# Patient Record
Sex: Male | Born: 1975 | Race: Black or African American | Hispanic: No | Marital: Married | Smoking: Never smoker
Health system: Southern US, Community
[De-identification: ages and names within clinical notes are randomized; demographics above are authoritative.]

## PROBLEM LIST (undated history)

## (undated) DIAGNOSIS — K648 Other hemorrhoids: Secondary | ICD-10-CM

## (undated) DIAGNOSIS — R300 Dysuria: Secondary | ICD-10-CM

## (undated) DIAGNOSIS — C61 Malignant neoplasm of prostate: Secondary | ICD-10-CM

## (undated) DIAGNOSIS — N486 Induration penis plastica: Secondary | ICD-10-CM

## (undated) DIAGNOSIS — N35919 Unspecified urethral stricture, male, unspecified site: Secondary | ICD-10-CM

## (undated) HISTORY — PX: PROSTATE BIOPSY: SHX241

## (undated) HISTORY — PX: COLONOSCOPY WITH ESOPHAGOGASTRODUODENOSCOPY (EGD): SHX5779

---

## 2005-07-11 ENCOUNTER — Ambulatory Visit: Payer: Self-pay | Admitting: Sports Medicine

## 2007-02-26 ENCOUNTER — Emergency Department (HOSPITAL_COMMUNITY): Admission: EM | Admit: 2007-02-26 | Discharge: 2007-02-26 | Payer: Self-pay | Admitting: Family Medicine

## 2013-01-08 ENCOUNTER — Ambulatory Visit: Payer: Self-pay | Admitting: Physician Assistant

## 2013-01-08 VITALS — BP 124/78 | HR 64 | Temp 98.2°F | Resp 18 | Ht 72.0 in | Wt 237.0 lb

## 2013-01-08 DIAGNOSIS — Z0289 Encounter for other administrative examinations: Secondary | ICD-10-CM

## 2013-01-08 NOTE — Progress Notes (Signed)
Patient ID: Dennis Newton MRN: [id number], DOB: 1975-11-03 37 y.o. Date of Encounter: 01/08/2013, 2:30 PM  Primary Physician: No primary provider on file.  Chief Complaint: DOT Physical   HPI: 37 y.o. y/o male with history noted below here for DOT physical. Doing well. No issues/complaints. Recertification. Generally healthy. No significant PMH, surgical history, or family history. Father passed form a fall in [place].   SP. GR. 1.005 Protein trace Blood neg Sugar neg  Review of Systems: Consitutional: No fever, chills, fatigue, night sweats, lymphadenopathy, or weight changes. Eyes: No visual changes, eye redness, or discharge. ENT/Mouth: Ears: No otalgia, tinnitus, hearing loss, discharge. Nose: No congestion, rhinorrhea, sinus pain, or epistaxis. Throat: No sore throat, post nasal drip, or teeth pain. Cardiovascular: No CP, palpitations, diaphoresis, DOE, edema, orthopnea, PND. Respiratory: No cough, hemoptysis, SOB, or wheezing. Gastrointestinal: No anorexia, dysphagia, reflux, pain, nausea, vomiting, hematemesis, diarrhea, constipation, BRBPR, or melena. Genitourinary: No dysuria, frequency, urgency, hematuria, incontinence, nocturia, decreased urinary stream, discharge, impotence, or testicular pain/masses. Musculoskeletal: No decreased ROM, myalgias, stiffness, joint swelling, or weakness. Skin: No rash, erythema, lesion changes, pain, warmth, jaundice, or pruritis. Neurological: No headache, dizziness, syncope, seizures, tremors, memory loss, coordination problems, or paresthesias. Psychological: No anxiety, depression, hallucinations, SI/HI. Endocrine: No fatigue, polydipsia, polyphagia, polyuria, or known diabetes. All other systems were reviewed and are otherwise negative.  History reviewed. No pertinent past medical history.   History reviewed. No pertinent past surgical history.  Home Meds:  Prior to Admission medications   Not on File    Allergies: No Known  Allergies  History   Social History  . Marital Status: Married    Spouse Name: N/A    Number of Children: N/A  . Years of Education: N/A   Occupational History  . Not on file.   Social History Main Topics  . Smoking status: Never Smoker   . Smokeless tobacco: Not on file  . Alcohol Use: No  . Drug Use: No  . Sexually Active: Not on file   Other Topics Concern  . Not on file   Social History Narrative  . No narrative on file    History reviewed. No pertinent family history.  Physical Exam: Blood pressure 124/78, pulse 64, temperature 98.2 F (36.8 C), temperature source Oral, resp. rate 18, height 6' (1.829 m), weight 237 lb (107.502 kg), SpO2 100.00%.  General: Well developed, well nourished, in no acute distress. HEENT: Normocephalic, atraumatic. Conjunctiva pink, sclera non-icteric. Pupils 2 mm constricting to 1 mm, round, regular, and equally reactive to light and accomodation. EOMI. Internal auditory canal clear. TMs with good cone of light and without pathology. Nasal mucosa pink. Nares are without discharge. No sinus tenderness. Oral mucosa pink. Dentition normal. Pharynx without exudate.   Neck: Supple. Trachea midline. No thyromegaly. Full ROM. No lymphadenopathy. Lungs: Clear to auscultation bilaterally without wheezes, rales, or rhonchi. Breathing is of normal effort and unlabored. Cardiovascular: RRR with S1 S2. No murmurs, rubs, or gallops appreciated. Distal pulses 2+ symmetrically. No carotid or abdominal bruits. Abdomen: Soft, non-tender, non-distended with normoactive bowel sounds. No hepatosplenomegaly or masses. No rebound/guarding. No CVA tenderness. Without hernias.  Genitourinary: Circumcised male. No penile lesions. Testes descended bilaterally, and smooth without tenderness or masses.  Musculoskeletal: Full range of motion and 5/5 strength throughout. Without swelling, atrophy, tenderness, crepitus, or warmth. Extremities without clubbing, cyanosis, or  edema. Calves supple. Skin: Warm and moist without erythema, ecchymosis, wounds, or rash. Neuro: A+Ox3. CN II-XII grossly intact. Moves all extremities spontaneously.  Full sensation throughout. Normal gait. DTR 2+ throughout upper and lower extremities. Finger to nose intact. Psych:  Responds to questions appropriately with a normal affect.    Assessment/Plan:  37 y.o. y/o male here for DOT physical. -Cleared -Two year card issued -Form completed -RTC prn  Signed, Eula Listen, PA-C 01/08/2013 2:30 PM

## 2013-01-17 ENCOUNTER — Encounter: Payer: Self-pay | Admitting: Physician Assistant

## 2015-01-16 ENCOUNTER — Ambulatory Visit: Payer: 59 | Admitting: Family Medicine

## 2015-01-16 ENCOUNTER — Encounter: Payer: Self-pay | Admitting: Family Medicine

## 2015-01-16 ENCOUNTER — Ambulatory Visit (INDEPENDENT_AMBULATORY_CARE_PROVIDER_SITE_OTHER): Payer: 59 | Admitting: Family Medicine

## 2015-01-16 ENCOUNTER — Ambulatory Visit (HOSPITAL_COMMUNITY)
Admission: RE | Admit: 2015-01-16 | Discharge: 2015-01-16 | Disposition: A | Discharge status: Home / Self Care | Payer: 59 | Source: Ambulatory Visit | Attending: Family Medicine | Admitting: Family Medicine

## 2015-01-16 ENCOUNTER — Other Ambulatory Visit: Payer: Self-pay | Admitting: Family Medicine

## 2015-01-16 VITALS — BP 120/90 | HR 66 | Temp 98.3°F | Resp 16 | Ht 72.75 in | Wt 235.0 lb

## 2015-01-16 DIAGNOSIS — R1013 Epigastric pain: Secondary | ICD-10-CM

## 2015-01-16 DIAGNOSIS — Z0000 Encounter for general adult medical examination without abnormal findings: Secondary | ICD-10-CM

## 2015-01-16 DIAGNOSIS — K921 Melena: Secondary | ICD-10-CM | POA: Diagnosis not present

## 2015-01-16 DIAGNOSIS — K7469 Other cirrhosis of liver: Secondary | ICD-10-CM

## 2015-01-16 LAB — POCT CBC
Granulocyte percent: 43.6 %G (ref 37–80)
HCT, POC: 43.8 % (ref 43.5–53.7)
Hemoglobin: 14.3 g/dL (ref 14.1–18.1)
Lymph, poc: 3 (ref 0.6–3.4)
MCH, POC: 28.7 pg (ref 27–31.2)
MCHC: 32.7 g/dL (ref 31.8–35.4)
MCV: 87.7 fL (ref 80–97)
MID (cbc): 0.5 (ref 0–0.9)
MPV: 8.4 fL (ref 0–99.8)
POC Granulocyte: 2.7 (ref 2–6.9)
POC LYMPH PERCENT: 48.2 %L (ref 10–50)
POC MID %: 8.2 %M (ref 0–12)
Platelet Count, POC: 243 10*3/uL (ref 142–424)
RBC: 4.99 M/uL (ref 4.69–6.13)
RDW, POC: 13.5 %
WBC: 6.3 10*3/uL (ref 4.6–10.2)

## 2015-01-16 MED ORDER — OMEPRAZOLE 20 MG PO CPDR
20.0000 mg | DELAYED_RELEASE_CAPSULE | Freq: Every day | ORAL | Status: DC
Start: 2015-01-16 — End: 2015-07-15

## 2015-01-16 MED ORDER — IOHEXOL 300 MG/ML  SOLN
100.0000 mL | Freq: Once | INTRAMUSCULAR | Status: AC | PRN
  Administered 2015-01-16: 100 mL via INTRAVENOUS

## 2015-01-16 NOTE — Progress Notes (Signed)
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  Patient's nonsmoker and nondrinker. He has no known medical problems today.  Objective: Patient is well-developed, well-nourished adult male in no acute distress. HEENT: Unremarkable with normal fundi, normal TMs, normal oropharynx Neck: Supple no adenopathy Chest: Clear Heart: Regular no murmur Abdomen: Soft but tender in the epigastrium with fullness there. Skin: Normal  Genitalia: Normal Extremities: Normal  Assessment: Negative evaluate the abdominal pain before we can allow per minute

## 2015-01-16 NOTE — Patient Instructions (Signed)
Go to [facility] for CT scan Abdomen and Pelvis with contrast Enter the ER and Register as an [place] [phone]

## 2015-01-16 NOTE — Progress Notes (Unsigned)
CT report shows hepatic cirrhosis.     ICD-9-CM ICD-10-CM   1. Abdominal pain, epigastric 789.06 R10.13 omeprazole (PRILOSEC) 20 MG capsule     Mitochondrial Antibodies  2. Other cirrhosis of liver 571.5 K74.69 ANA     Ceruloplasmin     IBC Panel     Alpha-1-Antitrypsin     Hepatitis C antibody     Hepatitis B surface antibody     Mitochondrial Antibodies     Signed, Robyn Haber, MD

## 2015-01-16 NOTE — Progress Notes (Addendum)
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 He describes pain as mild most of the time but punctuated with sharp stabbing pains that lasts just seconds.  He does not smoke or drink He's never had this before, does not have nausea or vomiting  He has noted some blood in his stool.  Objective: Is a well-developed very athletic appearing adult male in no acute distress HEENT: Unremarkable Neck: Supple no adenopathy Chest: Clear Heart: Regular no murmur Abdomen: Tender epigastrium with fullness, no rebound or guarding. No hernia Skin: No rash Extremities: No edema Results for orders placed or performed in visit on 01/16/15  POCT CBC  Result Value Ref Range   WBC 6.3 4.6 - 10.2 K/uL   Lymph, poc 3.0 0.6 - 3.4   POC LYMPH PERCENT 48.2 10 - 50 %L   MID (cbc) 0.5 0 - 0.9   POC MID % 8.2 0 - 12 %M   POC Granulocyte 2.7 2 - 6.9   Granulocyte percent 43.6 37 - 80 %G   RBC 4.99 4.69 - 6.13 M/uL   Hemoglobin 14.3 14.1 - 18.1 g/dL   HCT, POC 43.8 43.5 - 53.7 %   MCV 87.7 80 - 97 fL   MCH, POC 28.7 27 - 31.2 pg   MCHC 32.7 31.8 - 35.4 g/dL   RDW, POC 13.5 %   Platelet Count, POC 243 142 - 424 K/uL   MPV 8.4 0 - 99.8 fL    Assessment: Unclear what is causing the abdominal pain. It's definitely suggestive of ulcer disease or pancreatitis  Plan: Abdominal CT with and without contrast today. This chart was scribed in my presence and reviewed by me personally.    ICD-9-CM ICD-10-CM   1. Abdominal pain, epigastric 789.06 R10.13 CT Abdomen Pelvis W Contrast     POCT CBC   CT called to report that patient has cirrhosis. I have written for future lab work to be done tomorrow and will call patient tonight to let him know.  Signed, Robyn Haber, MD   Signed, Carola Frost.D.

## 2015-01-17 ENCOUNTER — Other Ambulatory Visit: Payer: Self-pay | Admitting: Family Medicine

## 2015-01-17 ENCOUNTER — Other Ambulatory Visit (INDEPENDENT_AMBULATORY_CARE_PROVIDER_SITE_OTHER): Payer: 59 | Admitting: Emergency Medicine

## 2015-01-17 ENCOUNTER — Ambulatory Visit (INDEPENDENT_AMBULATORY_CARE_PROVIDER_SITE_OTHER): Payer: Self-pay | Admitting: Emergency Medicine

## 2015-01-17 VITALS — BP 126/78 | HR 54 | Temp 98.1°F | Resp 16 | Ht 72.9 in | Wt 236.2 lb

## 2015-01-17 DIAGNOSIS — K7469 Other cirrhosis of liver: Secondary | ICD-10-CM | POA: Diagnosis not present

## 2015-01-17 DIAGNOSIS — R1013 Epigastric pain: Secondary | ICD-10-CM

## 2015-01-17 DIAGNOSIS — Z029 Encounter for administrative examinations, unspecified: Secondary | ICD-10-CM

## 2015-01-17 LAB — HEPATITIS C ANTIBODY: HCV Ab: NEGATIVE

## 2015-01-17 LAB — HEPATITIS B SURFACE ANTIBODY, QUANTITATIVE: Hepatitis B-Post: 0.7 m[IU]/mL

## 2015-01-17 NOTE — Addendum Note (Signed)
Addended by: Robyn Haber on: 01/17/2015 07:56 AM   Modules accepted: Orders

## 2015-01-17 NOTE — Progress Notes (Signed)
[facility] [address], [place] [address] [phone]  Date:  01/17/2015   Name:  Dennis Newton   DOB:  1976-04-09   MRN:  [id number]  PCP:  No PCP Per Patient    Chief Complaint: Follow-up   History of Present Illness:  Dennis Newton is a 39 y.o. very pleasant male patient who presents with the following:  DOT  There are no active problems to display for this patient.   History reviewed. No pertinent past medical history.  History reviewed. No pertinent past surgical history.  History  Substance Use Topics  . Smoking status: Never Smoker   . Smokeless tobacco: Not on file  . Alcohol Use: No    History reviewed. No pertinent family history.  No Known Allergies  Medication list has been reviewed and updated.  Current Outpatient Prescriptions on File Prior to Visit  Medication Sig Dispense Refill  . omeprazole (PRILOSEC) 20 MG capsule Take 1 capsule (20 mg total) by mouth daily. 30 capsule 3   No current facility-administered medications on file prior to visit.    Review of Systems:  As per HPI, otherwise negative.    Physical Examination: Filed Vitals:   01/17/15 0834  BP: 126/78  Pulse: 54  Temp: 98.1 F (36.7 C)  Resp: 16   Filed Vitals:   01/17/15 0834  Height: 6' 0.9" (1.852 m)  Weight: 236 lb 3.2 oz (107.14 kg)   Body mass index is 31.24 kg/(m^2). Ideal Body Weight: Weight in (lb) to have BMI = 25: 188.6  GEN: WDWN, NAD, Non-toxic, A & O x 3 HEENT: Atraumatic, Normocephalic. Neck supple. No masses, No LAD. Ears and Nose: No external deformity. CV: RRR, No M/G/R. No JVD. No thrill. No extra heart sounds. PULM: CTA B, no wheezes, crackles, rhonchi. No retractions. No resp. distress. No accessory muscle use. ABD: S, NT, ND, +BS. No rebound. No HSM. EXTR: No c/c/e NEURO Normal gait.  PSYCH: Normally interactive. Conversant. Not depressed or anxious appearing.  Calm demeanor.    Assessment and  Plan: DOT  Signed,  Ellison Carwin, MD

## 2015-01-18 ENCOUNTER — Telehealth: Payer: Self-pay | Admitting: *Deleted

## 2015-01-18 NOTE — Progress Notes (Signed)
Patient came in on 01/17/15 for bloodwork

## 2015-01-18 NOTE — Telephone Encounter (Signed)
Called pt back and gave results from Dr. Carlean Jews regarding CT results.  Pt was told to return for recheck on Tuesday between 4-8:30pm.  Pt understood

## 2015-01-19 LAB — MITOCHONDRIAL ANTIBODIES: Mitochondrial M2 Ab, IgG: 0.33 (ref <0.91)

## 2015-01-19 LAB — ANA: Anti Nuclear Antibody(ANA): NEGATIVE

## 2015-01-19 LAB — IRON AND TIBC
%SAT: 50 % (ref 20–55)
Iron: 130 ug/dL (ref 42–165)
TIBC: 262 ug/dL (ref 215–435)
UIBC: 132 ug/dL (ref 125–400)

## 2015-01-20 ENCOUNTER — Ambulatory Visit (INDEPENDENT_AMBULATORY_CARE_PROVIDER_SITE_OTHER): Payer: 59 | Admitting: Family Medicine

## 2015-01-20 ENCOUNTER — Encounter: Payer: Self-pay | Admitting: Family Medicine

## 2015-01-20 VITALS — BP 112/78 | HR 54 | Temp 98.5°F | Resp 20 | Ht 72.75 in | Wt 241.0 lb

## 2015-01-20 DIAGNOSIS — K921 Melena: Secondary | ICD-10-CM

## 2015-01-20 DIAGNOSIS — R1013 Epigastric pain: Secondary | ICD-10-CM

## 2015-01-20 DIAGNOSIS — R935 Abnormal findings on diagnostic imaging of other abdominal regions, including retroperitoneum: Secondary | ICD-10-CM | POA: Diagnosis not present

## 2015-01-20 DIAGNOSIS — G8929 Other chronic pain: Secondary | ICD-10-CM

## 2015-01-20 NOTE — Progress Notes (Addendum)
Subjective:  This chart was scribed for Robyn Haber, MD by Molli Posey, Medical scribe. This patient was seen in ROOM 5 and the patient's care was started 7:59 PM.   Patient ID: Dennis Newton, male    DOB: 15-Nov-1975, 39 y.o.   MRN: [id number]   Chief Complaint  Patient presents with   Follow-up    Results for DOT    HPI HPI Comments: Dennis Newton is a 39 y.o. male who presents to [facility] for results for his DOT from last week. Dr. Ouida Sills completed his DOT noting patient's abdominal exam is normal.   Pt complains of abdominal pain and states that he has noticed blood in his stool recently. Pt states he has been taking omeprazole regularly since his visit here several days ago. He states he does not take large amounts of ibuprofen and says he takes no other medications.   He reports no alleviating or modifying factors at this time. Pt reports no pertinent past medical history.   History reviewed. No pertinent past medical history. There are no active problems to display for this patient.  No Known Allergies Current Outpatient Prescriptions on File Prior to Visit  Medication Sig Dispense Refill   omeprazole (PRILOSEC) 20 MG capsule Take 1 capsule (20 mg total) by mouth daily. 30 capsule 3   No current facility-administered medications on file prior to visit.   History   Social History   Marital Status: Married    Spouse Name: N/A   Number of Children: N/A   Years of Education: N/A   Occupational History   Not on file.   Social History Main Topics   Smoking status: Never Smoker    Smokeless tobacco: Never Used   Alcohol Use: No   Drug Use: No   Sexual Activity: Not on file   Other Topics Concern   Not on file   Social History Narrative   .  Review of Systems  Gastrointestinal: Positive for abdominal pain and blood in stool.  All other systems reviewed and are negative.      Objective:   Physical Exam  Constitutional: He is oriented  to person, place, and time. He appears well-developed and well-nourished.  HENT:  Head: Normocephalic and atraumatic.  Eyes: Right eye exhibits no discharge. Left eye exhibits no discharge.  Neck: Neck supple. No tracheal deviation present.  Cardiovascular: Normal rate.   Pulmonary/Chest: Effort normal. No respiratory distress.  Abdominal: He exhibits no distension.  Patient has fullness and tenderness in the epigastrium. He has no splenomegaly.  The CT scan does show an irregularity of the liver.  Neurological: He is alert and oriented to person, place, and time.  Skin: Skin is warm and dry.  Psychiatric: He has a normal mood and affect. His behavior is normal.  Nursing note and vitals reviewed.  Results for orders placed or performed in visit on 01/17/15  Hepatitis C antibody  Result Value Ref Range   HCV Ab NEGATIVE NEGATIVE  Hepatitis B surface antibody  Result Value Ref Range   Hepatitis B-Post 0.7 mIU/mL  Mitochondrial Antibodies  Result Value Ref Range   Mitochondrial M2 Ab, IgG 0.33 <0.91  ANA  Result Value Ref Range   Anit Nuclear Antibody(ANA) NEG NEGATIVE  Ceruloplasmin  Result Value Ref Range   Ceruloplasmin    Alpha-1-Antitrypsin  Result Value Ref Range   A-1 Antitrypsin, Ser    Iron and TIBC  Result Value Ref Range   Iron 130 42 - 165 ug/dL  UIBC 132 125 - 400 ug/dL   TIBC 262 215 - 435 ug/dL   %SAT 50 20 - 55 %    Filed Vitals:   01/20/15 1941  BP: 112/78  Pulse: 54  Temp: 98.5 F (36.9 C)  TempSrc: Oral  Resp: 20  Height: 6' 0.75" (1.848 m)  Weight: 241 lb (109.317 kg)  SpO2: 97%      Assessment & Plan:  I'm concerned about the abnormal liver on the CT scan. All the lab looks pretty reassuring but with the recent development of some blood in the stools and persistent epigastric pain (it has improved on the Prilosec), further evaluation is indicated  This chart was scribed in my presence and reviewed by me personally.    ICD-9-CM ICD-10-CM    1. Abdominal pain, chronic, epigastric 789.06 R10.13 Ambulatory referral to Gastroenterology   338.29 G89.29 Comprehensive metabolic panel  2. Blood in stool 578.1 K92.1 Ambulatory referral to Gastroenterology     Comprehensive metabolic panel  3. Abnormal abdominal CT scan 793.6 R93.5 Comprehensive metabolic panel   I have asked the lab to include liver function tests on the blood drawn several days ago.  Signed, Robyn Haber, MD

## 2015-01-20 NOTE — Addendum Note (Signed)
Addended by: Robyn Haber on: 01/20/2015 10:29 PM   Modules accepted: Orders, Level of Service

## 2015-01-21 LAB — CERULOPLASMIN: Ceruloplasmin: 34 mg/dL (ref 18–36)

## 2015-01-21 LAB — ALPHA-1-ANTITRYPSIN: A-1 Antitrypsin, Ser: 124 mg/dL (ref 83–199)

## 2015-01-22 ENCOUNTER — Encounter: Payer: Self-pay | Admitting: *Deleted

## 2015-01-22 ENCOUNTER — Encounter: Payer: Self-pay | Admitting: Nurse Practitioner

## 2015-01-22 LAB — COMPREHENSIVE METABOLIC PANEL
ALT: 19 U/L (ref 0–53)
AST: 16 U/L (ref 0–37)
Albumin: 4.6 g/dL (ref 3.5–5.2)
Alkaline Phosphatase: 80 U/L (ref 39–117)
BUN: 10 mg/dL (ref 6–23)
CO2: 22 mEq/L (ref 19–32)
Calcium: 9.6 mg/dL (ref 8.4–10.5)
Chloride: 104 mEq/L (ref 96–112)
Creat: 0.95 mg/dL (ref 0.50–1.35)
Glucose, Bld: 84 mg/dL (ref 70–99)
Potassium: 4.5 mEq/L (ref 3.5–5.3)
Sodium: 142 mEq/L (ref 135–145)
Total Bilirubin: 0.5 mg/dL (ref 0.2–1.2)
Total Protein: 7.7 g/dL (ref 6.0–8.3)

## 2015-02-09 ENCOUNTER — Encounter: Payer: Self-pay | Admitting: Nurse Practitioner

## 2015-02-09 ENCOUNTER — Encounter: Payer: Self-pay | Admitting: Gastroenterology

## 2015-02-09 ENCOUNTER — Ambulatory Visit (INDEPENDENT_AMBULATORY_CARE_PROVIDER_SITE_OTHER): Payer: 59 | Admitting: Nurse Practitioner

## 2015-02-09 VITALS — BP 108/76 | HR 64 | Ht 73.0 in | Wt 244.0 lb

## 2015-02-09 DIAGNOSIS — R143 Flatulence: Secondary | ICD-10-CM | POA: Diagnosis not present

## 2015-02-09 DIAGNOSIS — K625 Hemorrhage of anus and rectum: Secondary | ICD-10-CM | POA: Insufficient documentation

## 2015-02-09 DIAGNOSIS — N5312 Painful ejaculation: Secondary | ICD-10-CM

## 2015-02-09 DIAGNOSIS — IMO0001 Reserved for inherently not codable concepts without codable children: Secondary | ICD-10-CM

## 2015-02-09 DIAGNOSIS — R1010 Upper abdominal pain, unspecified: Secondary | ICD-10-CM | POA: Diagnosis not present

## 2015-02-09 MED ORDER — NA SULFATE-K SULFATE-MG SULF 17.5-3.13-1.6 GM/177ML PO SOLN: ORAL | Status: DC

## 2015-02-09 NOTE — Patient Instructions (Addendum)
You have been scheduled for an endoscopy and colonoscopy. Please follow the written instructions given to you at your visit today. Please pick up your prep supplies at the pharmacy within the next 1-3 days. If you use inhalers (even only as needed), please bring them with you on the day of your procedure. Your physician has requested that you go to www.startemmi.com and enter the access code given to you at your visit today. This web site gives a general overview about your procedure. However, you should still follow specific instructions given to you by our office regarding your preparation for the procedure.  You may have a light breakfast the morning of prep day (the day before the procedure). You may choose from: eggs, toast, chicken noodle soup, crackers.  You should have your breakfast completed between 8:00 and 9:00 am.  Clear liquids only for the rest of the day on prep day and up until 3 hours before procedure.  Please purchase the following medications over the counter and take as directed: Align for 1 month  Please contact your primary care provider to make a referral to Urology regarding pain during sex

## 2015-02-09 NOTE — Progress Notes (Signed)
HPI :  Patient is a pleasant 39 year old male from [place] referred by PCP for abdominal pain and blood in stool.  Patient describes intermittent, non-radiating epigastric pain present for almost a month now. Pain is unrelated to meals or position. His stomach makes a lot of loud noises and he excessive flatus. There is no associated nausea / vomiting.   Daily PPI isn't helping.  [facility] of liver disease. No history of of significant ETOH use.   Patient recently began having painless rectal bleeding with bowel movements. No associated constipation nor diarrhea.   PSH: none  FMH: No liver disease or colon cancers  History  Substance Use Topics  . Smoking status: Never Smoker   . Smokeless tobacco: Never Used  . Alcohol Use: No   Current Outpatient Prescriptions  Medication Sig Dispense Refill  . omeprazole (PRILOSEC) 20 MG capsule Take 1 capsule (20 mg total) by mouth daily. 30 capsule 3   No current facility-administered medications for this visit.   No Known Allergies   Review of Systems: All systems reviewed and negative except where noted in HPI.    Ct Abdomen Pelvis W Contrast  01/16/2015   CLINICAL DATA:  Epigastric pain, bright red blood in stool.  EXAM: CT ABDOMEN AND PELVIS WITH CONTRAST  TECHNIQUE: Multidetector CT imaging of the abdomen and pelvis was performed using the standard protocol following bolus administration of intravenous contrast.  CONTRAST:  134mL OMNIPAQUE IOHEXOL 300 MG/ML  SOLN  COMPARISON:  None.  FINDINGS: Normal heart size.  Lung bases clear.  Lobular hepatic contour. Sub cm hypodensity right hepatic lobe (series 2, image 23). Spleen measures 11.7 cm cranial caudal. No radiodense gallstones or biliary ductal dilatation. No appreciable abnormality of the pancreas and adrenal glands. Upper pole right renal cysts. Otherwise, symmetric renal enhancement and excretion. No hydroureteronephrosis.  No overt colitis. Normal appendix. Small bowel loops are of  normal course and caliber. No free intraperitoneal air or fluid. No lymphadenopathy.  Normal caliber abdominal aorta and branch vessels. Mild right common iliac atherosclerotic disease.  Decompressed bladder.  Normal size prostate gland.  No acute osseous finding.  IMPRESSION: No acute abdominopelvic process identified by CT.  Liver contour is mildly lobular. Correlate with LFTs and clinical risk factors as of early cirrhosis is not excluded.  Sub cm hypodensity right hepatic lobe is nonspecific. Recommend six-month liver ultrasound follow-up.   Electronically Signed   By: Carlos Levering M.D.   On: 01/16/2015 19:01    Physical Exam: BP 108/76 mmHg  Pulse 64  Ht 6\' 1"  (1.854 m)  Wt 244 lb (110.678 kg)  BMI 32.20 kg/m2 Constitutional: Pleasant,well-developed, black male in no acute distress. HEENT: Normocephalic and atraumatic. Conjunctivae are normal. No scleral icterus. Neck supple.  Cardiovascular: Normal rate, regular rhythm.  Pulmonary/chest: Effort normal and breath sounds normal. No wheezing, rales or rhonchi. Abdominal: Soft, nondistended, nontender. Bowel sounds active throughout. There are no masses palpable. No hepatomegaly. Rectal: no external lesions. No masses felt on DRE. Stool heme negative.  Extremities: no edema Lymphadenopathy: No cervical adenopathy noted. Neurological: Alert and oriented to person place and time. Skin: Skin is warm and dry. No rashes noted. Psychiatric: Normal mood and affect. Behavior is normal.   ASSESSMENT AND PLAN:  51. 39 year old male with epigastric pain of unclear etiology. Patient denied NSAID use to me but after he left the office I saw in PCPs note that patient had been taking large quantities of ibuprofen.  Obviously peptic ulcer disease  needs to be excluded. Of note, CT scan of the abdomen and pelvis negative for acute findings. For further evaluation of epigastric pain patient will be scheduled for upper endoscopy. The benefits, risks, and  potential complications of EGD with possible biopsies  were discussed with the patient and he agrees to proceed.    2. Rectal bleeding. This may be from internal hemorrhoids but need to exclude other etiologies. For further evaluation patient will be scheduled for colonoscopy to be done at time of upper endoscopy. The risks, benefits, and alternatives to colonoscopy with possible biopsy and possible polypectomy were discussed with the patient and he consents to proceed.   3.  Abnormal CT scan - mildly lobular liver.Rule out cirrhosis   Platelets normal. No stigmata of chronic liver disease on exam. Patient is unaware of any chronic liver problems. No family history of liver disease. No significant alcohol use. His LFTs are normal. Autoimmune/genetic markers negative. Hepatitis C antibody negative. Hepatitis B antibody is negative.    Will evaluate for portal HTN at time of EGD.    Further evaluation of possible chronic liver disease once acute issues have resolved. He will need coag studies, HBsurface antigen  4. Penile pain with ejaculation. I asked him to contact PCP regarding this issue. Denies penile discharge. Asked patient to notify PCP about this issue, could have prostatitis.    CC: Robyn Haber, MD

## 2015-02-10 ENCOUNTER — Encounter: Payer: Self-pay | Admitting: Nurse Practitioner

## 2015-02-10 DIAGNOSIS — N5312 Painful ejaculation: Secondary | ICD-10-CM | POA: Insufficient documentation

## 2015-02-11 NOTE — Progress Notes (Signed)
Reviewed and agree with management. Recommend elastography for further w/u of possible cirrhosis Dennis Newton. Deatra Ina, M.D., [facility]

## 2015-02-16 ENCOUNTER — Other Ambulatory Visit: Payer: Self-pay

## 2015-02-16 DIAGNOSIS — R932 Abnormal findings on diagnostic imaging of liver and biliary tract: Secondary | ICD-10-CM

## 2015-02-16 NOTE — Progress Notes (Signed)
Dennis Newton, [place] - 02/09/15 >','<< Less Detail',event)" href="javascript:;">More Detail >>   Willia Craze, NP   Sent: Fri February 13, 2015 11:59 AM    To: Marlon Pel, RN        Message     Barbera Setters, can you schedule patient for ultrasound elastography for further evaluation of lobular liver. Rule out cirrhosis.     Thanks     ----- Message -----     From: Inda Castle, MD     Sent: 02/11/2015  2:57 PM      To: Willia Craze, NP                ----- Message -----     From: Willia Craze, NP     Sent: 02/11/2015 10:30 AM      To: Inda Castle, MD        Patient notified of the appt date and time at [facility] and to arrive at 6:45 for 7:00 on 03/11/15.  Patient needs to be NPO after midnignt

## 2015-03-11 ENCOUNTER — Ambulatory Visit (HOSPITAL_COMMUNITY)
Admission: RE | Admit: 2015-03-11 | Discharge: 2015-03-11 | Disposition: A | Discharge status: Home / Self Care | Payer: 59 | Source: Ambulatory Visit | Attending: Nurse Practitioner | Admitting: Nurse Practitioner

## 2015-03-11 DIAGNOSIS — R932 Abnormal findings on diagnostic imaging of liver and biliary tract: Secondary | ICD-10-CM

## 2015-03-11 DIAGNOSIS — N281 Cyst of kidney, acquired: Secondary | ICD-10-CM | POA: Insufficient documentation

## 2015-03-11 DIAGNOSIS — K7460 Unspecified cirrhosis of liver: Secondary | ICD-10-CM | POA: Diagnosis present

## 2015-04-07 ENCOUNTER — Encounter: Payer: Self-pay | Admitting: Gastroenterology

## 2015-04-07 ENCOUNTER — Ambulatory Visit (AMBULATORY_SURGERY_CENTER): Payer: 59 | Admitting: Gastroenterology

## 2015-04-07 VITALS — BP 119/83 | HR 51 | Temp 97.7°F | Resp 12 | Ht 73.0 in | Wt 244.0 lb

## 2015-04-07 DIAGNOSIS — K625 Hemorrhage of anus and rectum: Secondary | ICD-10-CM

## 2015-04-07 DIAGNOSIS — K648 Other hemorrhoids: Secondary | ICD-10-CM | POA: Diagnosis not present

## 2015-04-07 DIAGNOSIS — R1010 Upper abdominal pain, unspecified: Secondary | ICD-10-CM

## 2015-04-07 MED ORDER — SODIUM CHLORIDE 0.9 % IV SOLN: 500.0000 mL | INTRAVENOUS | Status: DC

## 2015-04-07 NOTE — Progress Notes (Signed)
Report to PACU, RN, vss, BBS= Clear.  

## 2015-04-07 NOTE — Op Note (Signed)
[place]  [address]. [place], [id number]   ENDOSCOPY PROCEDURE REPORT  PATIENT: Dennis, Newton  MR#: [id number] BIRTHDATE: [birth year] , 39  yrs. old GENDER: male ENDOSCOPIST: Inda Castle, MD REFERRED BY: PROCEDURE DATE:  04/07/2015 PROCEDURE:  EGD, diagnostic ASA CLASS:     Class I INDICATIONS:  epigastric pain. MEDICATIONS: Residual sedation present, Propofol 100 mg IV, and Lidocaine 200 mg IV TOPICAL ANESTHETIC:  DESCRIPTION OF PROCEDURE: After the risks benefits and alternatives of the procedure were thoroughly explained, informed consent was obtained.  The LB [id number] endoscope was introduced through the mouth and advanced to the second portion of the duodenum , Without limitations.  The instrument was slowly withdrawn as the mucosa was fully examined.      EXAM: The esophagus and gastroesophageal junction were completely normal in appearance.  The stomach was entered and closely examined.The antrum, angularis, and lesser curvature were well visualized, including a retroflexed view of the cardia and fundus. The stomach wall was normally distensable.  The scope passed easily through the pylorus into the duodenum.  Retroflexed views revealed no abnormalities.     The scope was then withdrawn from the patient and the procedure completed.  COMPLICATIONS: There were no immediate complications.  ENDOSCOPIC IMPRESSION: Normal appearing esophagus and GE junction, the stomach was well visualized and normal in appearance, normal appearing duodenum  RECOMMENDATIONS: Oh further therapy or workup since symptoms are improving  REPEAT EXAM:  eSigned:  Inda Castle, MD 04/07/2015 2:57 PM    CC:

## 2015-04-07 NOTE — Patient Instructions (Addendum)
Purchase from the pharmacy prepation h suppositories for relief from hemorroids. If this does not work make an appointment with dr. Deatra Ina for hemorroidal banding.  Purchase simethicone or mylicon at the pharmacy, take this medication 2-3 times daily for relief of gas. If this does not work call Dr. Kelby Fam office for an office visit.     YOU HAD AN ENDOSCOPIC PROCEDURE TODAY AT [facility]:   Refer to the procedure report that was given to you for any specific questions about what was found during the examination.  If the procedure report does not answer your questions, please call your gastroenterologist to clarify.  If you requested that your care partner not be given the details of your procedure findings, then the procedure report has been included in a sealed envelope for you to review at your convenience later.  YOU SHOULD EXPECT: Some feelings of bloating in the abdomen. Passage of more gas than usual.  Walking can help get rid of the air that was put into your GI tract during the procedure and reduce the bloating. If you had a lower endoscopy (such as a colonoscopy or flexible sigmoidoscopy) you may notice spotting of blood in your stool or on the toilet paper. If you underwent a bowel prep for your procedure, you may not have a normal bowel movement for a few days.  Please Note:  You might notice some irritation and congestion in your nose or some drainage.  This is from the oxygen used during your procedure.  There is no need for concern and it should clear up in a day or so.  SYMPTOMS TO REPORT IMMEDIATELY:   Following lower endoscopy (colonoscopy or flexible sigmoidoscopy):  Excessive amounts of blood in the stool  Significant tenderness or worsening of abdominal pains  Swelling of the abdomen that is new, acute  Fever of 100F or higher   Following upper endoscopy (EGD)  Vomiting of blood or coffee ground material  New chest pain or pain under the shoulder  blades  Painful or persistently difficult swallowing  New shortness of breath  Fever of 100F or higher  Black, tarry-looking stools  For urgent or emergent issues, a gastroenterologist can be reached at any hour by calling [phone].   DIET: Your first meal following the procedure should be a small meal and then it is ok to progress to your normal diet. Heavy or fried foods are harder to digest and may make you feel nauseous or bloated.  Likewise, meals heavy in dairy and vegetables can increase bloating.  Drink plenty of fluids but you should avoid alcoholic beverages for 24 hours.  ACTIVITY:  You should plan to take it easy for the rest of today and you should NOT DRIVE or use heavy machinery until tomorrow (because of the sedation medicines used during the test).    FOLLOW UP: Our staff will call the number listed on your records the next business day following your procedure to check on you and address any questions or concerns that you may have regarding the information given to you following your procedure. If we do not reach you, we will leave a message.  However, if you are feeling well and you are not experiencing any problems, there is no need to return our call.  We will assume that you have returned to your regular daily activities without incident.  If any biopsies were taken you will be contacted by phone or by letter within the next 1-3  weeks.  Please call us at [phone] if you have not heard about the biopsies in 3 weeks.    SIGNATURES/CONFIDENTIALITY: You and/or your care partner have signed paperwork which will be entered into your electronic medical record.  These signatures attest to the fact that that the information above on your After Visit Summary has been reviewed and is understood.  Full responsibility of the confidentiality of this discharge information lies with you and/or your care-partner.

## 2015-04-07 NOTE — Progress Notes (Signed)
Discharge instructions x 20 minutes. Patient continues to ask questions regarding grade of hemorroids, his complaints of gas and whether he should take align. Wife translating with patient also speaking for himself. Dr. Deatra Ina in for 2nd post procedure visit reviewed with the patient procedure results. Handouts for hemorroidal banding, high fiber and information for taking simethicone or mylicon 2-3 times per day for gas, if no improvement come back to dr Deatra Ina.

## 2015-04-07 NOTE — Op Note (Signed)
[place]  [address] [place], [phone]   COLONOSCOPY PROCEDURE REPORT  PATIENT: Dennis Newton, Dennis Newton  MR#: [id number] BIRTHDATE: [birth year] , 39  yrs. old GENDER: male ENDOSCOPIST: Inda Castle, MD REFERRED ID:[id number] Lauenstein, M.D. PROCEDURE DATE:  04/07/2015 PROCEDURE:   Colonoscopy, diagnostic First Screening Colonoscopy - Avg.  risk and is 50 yrs.  old or older Yes.  Prior Negative Screening - Now for repeat screening. N/A  History of Adenoma - Now for follow-up colonoscopy & has been > or = to 3 yrs.  N/A  Polyps removed today? No Recommend repeat exam, <10 yrs? No ASA CLASS:   Class I INDICATIONS:Colorectal Neoplasm Risk Assessment for this procedure is average risk and anal bleeding. MEDICATIONS: Monitored anesthesia care and Propofol 200 mg IV  DESCRIPTION OF PROCEDURE:   After the risks benefits and alternatives of the procedure were thoroughly explained, informed consent was obtained.  The digital rectal exam revealed no abnormalities of the rectum.   The [id number]  endoscope was introduced through the anus and advanced to the ileum. No adverse events experienced.   The quality of the prep was (Suprep was used) excellent.  The instrument was then slowly withdrawn as the colon was fully examined. Estimated blood loss is zero unless otherwise noted in this procedure report.      COLON FINDINGS: Internal hemorrhoids were found.   The examination was otherwise normal.  Retroflexed views revealed no abnormalities. The time to cecum = 4.1 Withdrawal time = 6.3   The scope was withdrawn and the procedure completed. COMPLICATIONS: There were no immediate complications.  ENDOSCOPIC IMPRESSION: 1.   Internal hemorrhoids 2.   The examination was otherwise normal  Limited rectal bleeding secondary to hemorrhoids  RECOMMENDATIONS: 1.  Hemorrhoidal suppositories as needed 2.  Colonoscopy 10 years  eSigned:  Inda Castle, MD  04/07/2015 2:52 PM   cc:   PATIENT NAME:  Dennis Newton, Dennis Newton MR#: [id number]

## 2015-04-08 ENCOUNTER — Telehealth: Payer: Self-pay | Admitting: *Deleted

## 2015-04-08 NOTE — Telephone Encounter (Signed)
  Follow up Call-  Call back number 04/07/2015  Post procedure Call Back phone  # [phone]  Permission to leave phone message Yes     Patient questions:  Do you have a fever, pain , or abdominal swelling? No. Pain Score  0 *  Have you tolerated food without any problems? Yes.    Have you been able to return to your normal activities? Yes.    Do you have any questions about your discharge instructions: Diet   No. Medications  No. Follow up visit  No.  Do you have questions or concerns about your Care? No.  Actions: * If pain score is 4 or above: No action needed, pain <4.

## 2015-05-14 ENCOUNTER — Ambulatory Visit (INDEPENDENT_AMBULATORY_CARE_PROVIDER_SITE_OTHER): Payer: 59 | Admitting: Family Medicine

## 2015-05-14 VITALS — BP 110/66 | HR 59 | Temp 98.3°F | Resp 18 | Ht 73.0 in | Wt 239.0 lb

## 2015-05-14 DIAGNOSIS — R309 Painful micturition, unspecified: Secondary | ICD-10-CM | POA: Diagnosis not present

## 2015-05-14 DIAGNOSIS — R3916 Straining to void: Secondary | ICD-10-CM

## 2015-05-14 DIAGNOSIS — IMO0002 Reserved for concepts with insufficient information to code with codable children: Secondary | ICD-10-CM

## 2015-05-14 LAB — POCT UA - MICROSCOPIC ONLY
Bacteria, U Microscopic: NEGATIVE
Casts, Ur, LPF, POC: NEGATIVE
Crystals, Ur, HPF, POC: NEGATIVE
Epithelial cells, urine per micros: NEGATIVE
MUCUS UA: NEGATIVE
Yeast, UA: NEGATIVE

## 2015-05-14 LAB — POCT URINALYSIS DIPSTICK
BILIRUBIN UA: NEGATIVE
GLUCOSE UA: NEGATIVE
KETONES UA: NEGATIVE
Leukocytes, UA: NEGATIVE
Nitrite, UA: NEGATIVE
PROTEIN UA: NEGATIVE
Spec Grav, UA: >=1.03
Urobilinogen, UA: 0.2
pH, UA: 6

## 2015-05-14 NOTE — Progress Notes (Signed)
   Subjective:    Patient ID: Dennis Newton, male    DOB: 03-23-1976, 39 y.o.   MRN: [id number]  Chief Complaint  Patient presents with  . Dysuria    x2-3 weeks now   . Dyspareunia   Medications, allergies, past medical history, surgical history, family history, social history and problem list reviewed and updated.  HPI  28 yom presents with dysuria, weak urine stream, and dyspareunia.   Sx have been ongoing for approx one month. Feels like straining more to urinate. Feels slight burning at times when he pees, not every time. No increased freq or urgency. Has felt a "burning" sensation in his penis when he ejaculates. Erections have not been as full as typical past month.   Denies abd pain, fevers, chills, n/v, penile dc, rash. No noticeable testicular pain.   Review of Systems See HPI.     Objective:   Physical Exam  Constitutional: He appears well-developed and well-nourished.  Non-toxic appearance. He does not have a sickly appearance. He does not appear ill. No distress.  BP 110/66 mmHg  Pulse 59  Temp(Src) 98.3 F (36.8 C) (Oral)  Resp 18  Ht 6\' 1"  (1.854 m)  Wt 239 lb (108.41 kg)  BMI 31.54 kg/m2  SpO2 98%   Abdominal: Soft. Normal appearance and bowel sounds are normal. There is no tenderness. There is no rigidity, no rebound, no guarding, no CVA tenderness, no tenderness at McBurney's point and negative Murphy's sign.  Genitourinary: Rectum normal, prostate normal and penis normal. Prostate is not enlarged and not tender. Right testis shows no mass, no swelling and no tenderness. Left testis shows no mass, no swelling and no tenderness. No penile erythema or penile tenderness. No discharge found.  No epididymis tenderness.   Lymphadenopathy:       Right: No inguinal adenopathy present.       Left: No inguinal adenopathy present.    Results for orders placed or performed in visit on 05/14/15  POCT urinalysis dipstick  Result Value Ref Range   Color, UA yellow      Clarity, UA clear    Glucose, UA neg    Bilirubin, UA neg    Ketones, UA neg    Spec Grav, UA >=1.030    Blood, UA trace-intact    pH, UA 6.0    Protein, UA neg    Urobilinogen, UA 0.2    Nitrite, UA neg    Leukocytes, UA Negative Negative  POCT UA - Microscopic Only  Result Value Ref Range   WBC, Ur, HPF, POC 0-1    RBC, urine, microscopic 0-2    Bacteria, U Microscopic neg    Mucus, UA neg    Epithelial cells, urine per micros neg    Crystals, Ur, HPF, POC neg    Casts, Ur, LPF, POC neg    Yeast, UA neg       Assessment & Plan:   Pain with urination - Plan: POCT urinalysis dipstick, POCT UA - Microscopic Only, GC/Chlamydia Probe Amp  Straining to void  Dyspareunia - Plan: GC/Chlamydia Probe Amp --ua normal other than dehydration --increase water intake --await gc/ct --prostate exam normal, no fevers, chills, vitals normal --> doubt prostatitis --no penile or testicular ttp --if results normal and sx persist will refer to urology --> pt agreeable  Julieta Gutting, PA-C Physician Assistant-Certified [facility]  05/14/2015 5:23 PM

## 2015-05-14 NOTE — Patient Instructions (Signed)
Your urine swab was normal today and did not show any signs of an infection.  You were very dehydrated today which may cause some burning when you urinate.  We are sending more labs today from the urine and I'll let you know these results early next week.  If all the labs come back normal next week and your symptoms are still ongoing we will refer you to urology, they are the specialists for this type of deal.

## 2015-05-15 LAB — GC/CHLAMYDIA PROBE AMP
CT PROBE, AMP APTIMA: NEGATIVE
GC PROBE AMP APTIMA: NEGATIVE

## 2015-05-18 NOTE — Addendum Note (Signed)
Addended byJulieta Gutting on: 05/18/2015 05:46 PM   Modules accepted: Orders

## 2015-06-24 ENCOUNTER — Other Ambulatory Visit: Payer: Self-pay | Admitting: Urology

## 2015-07-10 ENCOUNTER — Encounter (HOSPITAL_BASED_OUTPATIENT_CLINIC_OR_DEPARTMENT_OTHER): Payer: Self-pay | Admitting: *Deleted

## 2015-07-15 ENCOUNTER — Encounter (HOSPITAL_BASED_OUTPATIENT_CLINIC_OR_DEPARTMENT_OTHER): Payer: Self-pay | Admitting: *Deleted

## 2015-07-15 NOTE — Progress Notes (Signed)
NPO AFTER MN WITH EXCEPTION CLEAR LIQUIDS UNTIL 0730 (NO CREAM/MILK PRODUCTS).  ARRIVE AT 1215. NEEDS HG.  

## 2015-07-20 ENCOUNTER — Ambulatory Visit (HOSPITAL_BASED_OUTPATIENT_CLINIC_OR_DEPARTMENT_OTHER)
Admission: RE | Admit: 2015-07-20 | Discharge: 2015-07-20 | Disposition: A | Discharge status: Home / Self Care | Payer: 59 | Source: Ambulatory Visit | Attending: Urology | Admitting: Urology

## 2015-07-20 ENCOUNTER — Ambulatory Visit (HOSPITAL_BASED_OUTPATIENT_CLINIC_OR_DEPARTMENT_OTHER): Payer: 59 | Admitting: Anesthesiology

## 2015-07-20 ENCOUNTER — Encounter (HOSPITAL_BASED_OUTPATIENT_CLINIC_OR_DEPARTMENT_OTHER): Payer: Self-pay | Admitting: Anesthesiology

## 2015-07-20 ENCOUNTER — Encounter (HOSPITAL_BASED_OUTPATIENT_CLINIC_OR_DEPARTMENT_OTHER)
Admission: RE | Disposition: A | Discharge status: Home / Self Care | Payer: Self-pay | Source: Ambulatory Visit | Attending: Urology

## 2015-07-20 DIAGNOSIS — E669 Obesity, unspecified: Secondary | ICD-10-CM | POA: Insufficient documentation

## 2015-07-20 DIAGNOSIS — N486 Induration penis plastica: Secondary | ICD-10-CM | POA: Diagnosis not present

## 2015-07-20 DIAGNOSIS — N4889 Other specified disorders of penis: Secondary | ICD-10-CM | POA: Diagnosis present

## 2015-07-20 DIAGNOSIS — Z6831 Body mass index (BMI) 31.0-31.9, adult: Secondary | ICD-10-CM | POA: Diagnosis not present

## 2015-07-20 HISTORY — PX: PENILE BIOPSY: SHX6013

## 2015-07-20 HISTORY — DX: Induration penis plastica: N48.6

## 2015-07-20 HISTORY — DX: Other hemorrhoids: K64.8

## 2015-07-20 HISTORY — DX: Dysuria: R30.0

## 2015-07-20 HISTORY — DX: Unspecified urethral stricture, male, unspecified site: N35.919

## 2015-07-20 LAB — POCT HEMOGLOBIN-HEMACUE: HEMOGLOBIN: 14.1 g/dL (ref 13.0–17.0)

## 2015-07-20 SURGERY — BIOPSY, PENIS
Anesthesia: General

## 2015-07-20 MED ORDER — CEFAZOLIN SODIUM 1-5 GM-% IV SOLN
1.0000 g | INTRAVENOUS | Status: DC
  Filled 2015-07-20: qty 50

## 2015-07-20 MED ORDER — MIDAZOLAM HCL 2 MG/2ML IJ SOLN
INTRAMUSCULAR | Status: AC
  Filled 2015-07-20: qty 2

## 2015-07-20 MED ORDER — SODIUM CHLORIDE 0.9 % IJ SOLN
INTRAMUSCULAR | Status: DC | PRN
  Administered 2015-07-20 (×3): 90 mL

## 2015-07-20 MED ORDER — LACTATED RINGERS IV SOLN
INTRAVENOUS | Status: DC
  Administered 2015-07-20 (×2): via INTRAVENOUS
  Filled 2015-07-20: qty 1000

## 2015-07-20 MED ORDER — DEXAMETHASONE SODIUM PHOSPHATE 4 MG/ML IJ SOLN
INTRAMUSCULAR | Status: DC | PRN
  Administered 2015-07-20: 10 mg via INTRAVENOUS

## 2015-07-20 MED ORDER — PROPOFOL 10 MG/ML IV BOLUS
INTRAVENOUS | Status: DC | PRN
  Administered 2015-07-20: 200 mg via INTRAVENOUS

## 2015-07-20 MED ORDER — HYDROCODONE-ACETAMINOPHEN 5-325 MG PO TABS: 1.0000 | ORAL_TABLET | Freq: Four times a day (QID) | ORAL | Status: DC | PRN

## 2015-07-20 MED ORDER — HYDROCODONE-ACETAMINOPHEN 5-325 MG PO TABS
1.0000 | ORAL_TABLET | Freq: Four times a day (QID) | ORAL | Status: DC | PRN
Start: 2015-07-20 — End: 2015-07-20
  Administered 2015-07-20: 1 via ORAL
  Filled 2015-07-20: qty 1

## 2015-07-20 MED ORDER — HYDROCODONE-ACETAMINOPHEN 5-325 MG PO TABS
ORAL_TABLET | ORAL | Status: AC
  Filled 2015-07-20: qty 1

## 2015-07-20 MED ORDER — BUPIVACAINE HCL (PF) 0.25 % IJ SOLN
INTRAMUSCULAR | Status: DC | PRN
  Administered 2015-07-20: 10 mL

## 2015-07-20 MED ORDER — LIDOCAINE HCL (CARDIAC) 20 MG/ML IV SOLN
INTRAVENOUS | Status: DC | PRN
  Administered 2015-07-20: 100 mg via INTRAVENOUS

## 2015-07-20 MED ORDER — FENTANYL CITRATE (PF) 100 MCG/2ML IJ SOLN
INTRAMUSCULAR | Status: AC
  Filled 2015-07-20: qty 2

## 2015-07-20 MED ORDER — PROMETHAZINE HCL 25 MG/ML IJ SOLN
6.2500 mg | INTRAMUSCULAR | Status: DC | PRN
Start: 2015-07-20 — End: 2015-07-20
  Filled 2015-07-20: qty 1

## 2015-07-20 MED ORDER — SODIUM CHLORIDE 0.9 % IR SOLN
Status: DC | PRN
  Administered 2015-07-20: 500 mL

## 2015-07-20 MED ORDER — FENTANYL CITRATE (PF) 100 MCG/2ML IJ SOLN
25.0000 ug | INTRAMUSCULAR | Status: DC | PRN
  Administered 2015-07-20 (×2): 25 ug via INTRAVENOUS
  Filled 2015-07-20: qty 1

## 2015-07-20 MED ORDER — ONDANSETRON HCL 4 MG/2ML IJ SOLN
INTRAMUSCULAR | Status: DC | PRN
  Administered 2015-07-20: 4 mg via INTRAVENOUS

## 2015-07-20 MED ORDER — CEFAZOLIN SODIUM-DEXTROSE 2-3 GM-% IV SOLR
INTRAVENOUS | Status: AC
  Filled 2015-07-20: qty 50

## 2015-07-20 MED ORDER — DIAZEPAM 5 MG PO TABS: 5.0000 mg | ORAL_TABLET | Freq: Every day | ORAL | Status: DC

## 2015-07-20 MED ORDER — CEFAZOLIN SODIUM-DEXTROSE 2-3 GM-% IV SOLR
2.0000 g | INTRAVENOUS | Status: AC
  Administered 2015-07-20: 2 g via INTRAVENOUS
  Filled 2015-07-20: qty 50

## 2015-07-20 MED ORDER — FENTANYL CITRATE (PF) 100 MCG/2ML IJ SOLN
INTRAMUSCULAR | Status: DC | PRN
  Administered 2015-07-20: 25 ug via INTRAVENOUS
  Administered 2015-07-20: 50 ug via INTRAVENOUS
  Administered 2015-07-20: 25 ug via INTRAVENOUS

## 2015-07-20 MED ORDER — MIDAZOLAM HCL 5 MG/5ML IJ SOLN
INTRAMUSCULAR | Status: DC | PRN
  Administered 2015-07-20: 2 mg via INTRAVENOUS

## 2015-07-20 MED ORDER — FENTANYL CITRATE (PF) 100 MCG/2ML IJ SOLN
INTRAMUSCULAR | Status: AC
  Filled 2015-07-20: qty 6

## 2015-07-20 SURGICAL SUPPLY — 78 items
BAND RUBBER #16 2-1/2X1/16 STR (MISCELLANEOUS) IMPLANT
BANDAGE COBAN STERILE 2 (GAUZE/BANDAGES/DRESSINGS) ×1 IMPLANT
BLADE SURG 15 STRL LF DISP TIS (BLADE) ×1 IMPLANT
BLADE SURG 15 STRL SS (BLADE) ×2
CATH FOLEY 2WAY  3CC  8FR (CATHETERS)
CATH FOLEY 2WAY 3CC 8FR (CATHETERS) IMPLANT
CATH FOLEY 2WAY SLVR  5CC 16FR (CATHETERS)
CATH FOLEY 2WAY SLVR 5CC 16FR (CATHETERS) IMPLANT
CATH ROBINSON RED A/P 10FR (CATHETERS) IMPLANT
CATH ROBINSON RED A/P 8FR (CATHETERS) ×1 IMPLANT
COVER BACK TABLE 60X90IN (DRAPES) ×2 IMPLANT
COVER MAYO STAND STRL (DRAPES) ×2 IMPLANT
DRAPE EENT NEONATAL 1202 (DRAPE) IMPLANT
DRAPE PED LAPAROTOMY (DRAPES) IMPLANT
DRSG TEGADERM 2-3/8X2-3/4 SM (GAUZE/BANDAGES/DRESSINGS) ×2 IMPLANT
DRSG TELFA 3X8 NADH (GAUZE/BANDAGES/DRESSINGS) ×2 IMPLANT
ELECT NDL BLADE 2-5/6 (NEEDLE) IMPLANT
ELECT NDL TIP 2.8 STRL (NEEDLE) ×1 IMPLANT
ELECT NEEDLE BLADE 2-5/6 (NEEDLE) ×2 IMPLANT
ELECT NEEDLE TIP 2.8 STRL (NEEDLE) ×2 IMPLANT
ELECT REM PT RETURN 9FT ADLT (ELECTROSURGICAL) ×2
ELECT REM PT RETURN 9FT PED (ELECTROSURGICAL)
ELECTRODE REM PT RETRN 9FT PED (ELECTROSURGICAL) IMPLANT
ELECTRODE REM PT RTRN 9FT ADLT (ELECTROSURGICAL) ×1 IMPLANT
GAUZE XEROFORM 1X8 LF (GAUZE/BANDAGES/DRESSINGS) IMPLANT
GLOVE BIO SURGEON STRL SZ8 (GLOVE) ×2 IMPLANT
GOWN STRL REUS W/ TWL XL LVL3 (GOWN DISPOSABLE) ×1 IMPLANT
GOWN STRL REUS W/TWL XL LVL3 (GOWN DISPOSABLE) ×2
IV NS IRRIG 3000ML ARTHROMATIC (IV SOLUTION) IMPLANT
LOOP VESSEL MAXI BLUE (MISCELLANEOUS) IMPLANT
MANIFOLD NEPTUNE II (INSTRUMENTS) IMPLANT
NDL HYPO 30X.5 LL (NEEDLE) ×2 IMPLANT
NEEDLE HYPO 22GX1.5 SAFETY (NEEDLE) IMPLANT
NEEDLE HYPO 25X1 1.5 SAFETY (NEEDLE) ×2 IMPLANT
NEEDLE HYPO 30X.5 LL (NEEDLE) ×4 IMPLANT
NS IRRIG 500ML POUR BTL (IV SOLUTION) IMPLANT
PACK BASIN DAY SURGERY FS (CUSTOM PROCEDURE TRAY) ×2 IMPLANT
PAD DRESSING TELFA 3X8 NADH (GAUZE/BANDAGES/DRESSINGS) IMPLANT
PENCIL BUTTON HOLSTER BLD 10FT (ELECTRODE) ×2 IMPLANT
PLUG CATH AND CAP STER (CATHETERS) IMPLANT
RETRACTOR STAY HOOK 5MM (MISCELLANEOUS) ×1 IMPLANT
RETRACTOR STERILE 25.8CMX11.3 (INSTRUMENTS) ×2 IMPLANT
SET COLLECT BLD 25X3/4 12 (NEEDLE) ×1 IMPLANT
SPEAR EYE SURG WECK-CEL (MISCELLANEOUS) ×2 IMPLANT
SPONGE GAUZE 2X2 8PLY STRL LF (GAUZE/BANDAGES/DRESSINGS) IMPLANT
SPONGE LAP 4X18 X RAY DECT (DISPOSABLE) IMPLANT
SUT CHROMIC 3 0 SH 27 (SUTURE) ×2 IMPLANT
SUT CHROMIC 4 0 RB 1X27 (SUTURE) IMPLANT
SUT CHROMIC 5 0 P 3 (SUTURE) IMPLANT
SUT CHROMIC 5 0 RB 1 27 (SUTURE) IMPLANT
SUT CHROMIC 6 0 PS 4 (SUTURE) IMPLANT
SUT CHROMIC 7 0 TG140 8 (SUTURE) IMPLANT
SUT ETHIBOND 2 0 SH (SUTURE) ×4
SUT ETHIBOND 2 0 SH 36X2 (SUTURE) ×2 IMPLANT
SUT ETHIBOND 2 OS 4 DA (SUTURE) IMPLANT
SUT ETHIBOND 3-0 V-5 (SUTURE) IMPLANT
SUT PDS AB 4-0 RB1 27 (SUTURE) IMPLANT
SUT PROLENE 4 0 RB 1 (SUTURE)
SUT PROLENE 4-0 RB1 .5 CRCL 36 (SUTURE) IMPLANT
SUT PROLENE 6 0 C 1 30 (SUTURE) IMPLANT
SUT SILK 4 0 (SUTURE) ×2
SUT SILK 4-0 18XBRD TIE 12 (SUTURE) ×1 IMPLANT
SUT VIC AB 3-0 SH 27 (SUTURE) ×4
SUT VIC AB 3-0 SH 27X BRD (SUTURE) IMPLANT
SUT VIC AB 5-0 RB1 27 (SUTURE) IMPLANT
SUT VICRYL 6 0 RB 1 (SUTURE) IMPLANT
SYR 3ML 23GX1 SAFETY (SYRINGE) ×2 IMPLANT
SYR 50ML LL SCALE MARK (SYRINGE) ×2 IMPLANT
SYR BULB IRRIGATION 50ML (SYRINGE) ×2 IMPLANT
SYR CONTROL 10ML LL (SYRINGE) ×2 IMPLANT
TOWEL OR 17X24 6PK STRL BLUE (TOWEL DISPOSABLE) ×4 IMPLANT
TRAY DSU PREP LF (CUSTOM PROCEDURE TRAY) ×2 IMPLANT
TUBE CONNECTING 12X1/4 (SUCTIONS) ×2 IMPLANT
TUBE FEEDING 5FR 15 INCH (TUBING) IMPLANT
TUBE FEEDING 8FR 16IN STR KANG (MISCELLANEOUS) IMPLANT
WATER STERILE IRR 3000ML UROMA (IV SOLUTION) IMPLANT
WATER STERILE IRR 500ML POUR (IV SOLUTION) IMPLANT
YANKAUER SUCT BULB TIP NO VENT (SUCTIONS) ×2 IMPLANT

## 2015-07-20 NOTE — H&P (Signed)
Urology Admission H&P  Chief Complaint: penile pain  History of Present Illness: [name] is a 39yo with a hx of peyronies disease here for definitive management. His lesion has been stable for over 1 year. He denies any worsening LUTS  Past Medical History  Diagnosis Date  . Peyronie's disease   . Urethral stricture   . Dysuria   . Renal cyst, right   . Internal hemorrhoids    Past Surgical History  Procedure Laterality Date  . Colonoscopy with esophagogastroduodenoscopy (egd)  04-07-2015  propofol    Home Medications:  No prescriptions prior to admission   Allergies: No Known Allergies  History reviewed. No pertinent family history. Social History:  reports that he has never smoked. He has never used smokeless tobacco. He reports that he does not drink alcohol or use illicit drugs.  Review of Systems  All other systems reviewed and are negative.   Physical Exam:  Vital signs in last 24 hours: Temp:  [98.4 F (36.9 C)] 98.4 F (36.9 C) (09/19 1234) Pulse Rate:  [53] 53 (09/19 1234) Resp:  [16] 16 (09/19 1234) BP: (127)/(67) 127/67 mmHg (09/19 1234) SpO2:  [100 %] 100 % (09/19 1234) Weight:  [105.688 kg (233 lb)] 105.688 kg (233 lb) (09/19 1234) Physical Exam  Constitutional: He is oriented to person, place, and time. He appears well-developed and well-nourished.  HENT:  Head: Normocephalic and atraumatic.  Eyes: EOM are normal. Pupils are equal, round, and reactive to light.  Neck: Normal range of motion. No thyromegaly present.  Cardiovascular: Normal rate and regular rhythm.   Respiratory: Effort normal. No respiratory distress.  GI: Soft. He exhibits no distension.  Musculoskeletal: Normal range of motion.  Neurological: He is alert and oriented to person, place, and time.  Skin: Skin is warm and dry.  Psychiatric: He has a normal mood and affect. His behavior is normal. Judgment and thought content normal.    Laboratory Data:  No results found for this  or any previous visit (from the past 24 hour(s)). No results found for this or any previous visit (from the past 240 hour(s)). Creatinine: No results for input(s): CREATININE in the last 168 hours. Baseline Creatinine: unknown  Impression/Assessment:  39yo with peyronies disease  Plan:  The risks/benefits/alternatives to penile plication was explained to the patient and he understands and wishes to proceed with surgery  MCKENZIE, PATRICK L 07/20/2015, 12:52 PM

## 2015-07-20 NOTE — Discharge Instructions (Signed)
Please remove dressing in 48 hours or remove sooner if the head of the penis turns blue or you are having difficulty urinating  Call your surgeon if you experience:   1.  Fever over 101.0. 2.  Inability to urinate. 3.  Nausea and/or vomiting. 4.  Extreme swelling or bruising at the surgical site. 5.  Continued bleeding from the incision. 6.  Increased pain, redness or drainage from the incision. 7.  Problems related to your pain medication. 8. Any change in color,  and/or sensation 9. Any problems and/or concerns  Post Anesthesia Home Care Instructions  Activity: Get plenty of rest for the remainder of the day. A responsible adult should stay with you for 24 hours following the procedure.  For the next 24 hours, DO NOT: -Drive a car -Paediatric nurse -Drink alcoholic beverages -Take any medication unless instructed by your physician -Make any legal decisions or sign important papers.  Meals: Start with liquid foods such as gelatin or soup. Progress to regular foods as tolerated. Avoid greasy, spicy, heavy foods. If nausea and/or vomiting occur, drink only clear liquids until the nausea and/or vomiting subsides. Call your physician if vomiting continues.  Special Instructions/Symptoms: Your throat may feel dry or sore from the anesthesia or the breathing tube placed in your throat during surgery. If this causes discomfort, gargle with warm salt water. The discomfort should disappear within 24 hours.  If you had a scopolamine patch placed behind your ear for the management of post- operative nausea and/or vomiting:  1. The medication in the patch is effective for 72 hours, after which it should be removed.  Wrap patch in a tissue and discard in the trash. Wash hands thoroughly with soap and water. 2. You may remove the patch earlier than 72 hours if you experience unpleasant side effects which may include dry mouth, dizziness or visual disturbances. 3. Avoid touching the patch. Wash  your hands with soap and water after contact with the patch.

## 2015-07-20 NOTE — Anesthesia Preprocedure Evaluation (Addendum)
Anesthesia Evaluation  Patient identified by MRN, date of birth, ID band Patient awake    Reviewed: Allergy & Precautions, NPO status , Patient's Chart, lab work & pertinent test results  Airway Mallampati: II  TM Distance: >3 FB Neck ROM: Full    Dental  (+) Missing, Dental Advisory Given Left upper front missing:   Pulmonary neg pulmonary ROS,    Pulmonary exam normal breath sounds clear to auscultation       Cardiovascular negative cardio ROS Normal cardiovascular exam Rhythm:Regular Rate:Normal     Neuro/Psych negative neurological ROS  negative psych ROS   GI/Hepatic negative GI ROS, Neg liver ROS,   Endo/Other  negative endocrine ROS  Renal/GU Renal diseasePeyronie's disease.  negative genitourinary   Musculoskeletal negative musculoskeletal ROS (+)   Abdominal (+) + obese,   Peds negative pediatric ROS (+)  Hematology negative hematology ROS (+)   Anesthesia Other Findings   Reproductive/Obstetrics negative OB ROS                            Anesthesia Physical Anesthesia Plan  ASA: II  Anesthesia Plan: General   Post-op Pain Management:    Induction: Intravenous  Airway Management Planned: LMA  Additional Equipment:   Intra-op Plan:   Post-operative Plan: Extubation in OR  Informed Consent: I have reviewed the patients History and Physical, chart, labs and discussed the procedure including the risks, benefits and alternatives for the proposed anesthesia with the patient or authorized representative who has indicated his/her understanding and acceptance.   Dental advisory given  Plan Discussed with: CRNA  Anesthesia Plan Comments:         Anesthesia Quick Evaluation

## 2015-07-20 NOTE — Anesthesia Procedure Notes (Signed)
Procedure Name: LMA Insertion Date/Time: 07/20/2015 1:15 PM Performed by: Denna Haggard D Pre-anesthesia Checklist: Patient identified, Emergency Drugs available, Suction available and Patient being monitored Patient Re-evaluated:Patient Re-evaluated prior to inductionOxygen Delivery Method: Circle System Utilized Preoxygenation: Pre-oxygenation with 100% oxygen Intubation Type: IV induction Ventilation: Mask ventilation without difficulty LMA: LMA inserted LMA Size: 4.0 Number of attempts: 1 Airway Equipment and Method: Bite block Placement Confirmation: positive ETCO2 Tube secured with: Tape Dental Injury: Teeth and Oropharynx as per pre-operative assessment

## 2015-07-20 NOTE — Anesthesia Postprocedure Evaluation (Signed)
  Anesthesia Post-op Note  Patient: Dennis Newton  Procedure(s) Performed: Procedure(s) (LRB): PENILE PLICATION (N/A)  Patient Location: PACU  Anesthesia Type: General  Level of Consciousness: awake and alert   Airway and Oxygen Therapy: Patient Spontanous Breathing  Post-op Pain: mild  Post-op Assessment: Post-op Vital signs reviewed, Patient's Cardiovascular Status Stable, Respiratory Function Stable, Patent Airway and No signs of Nausea or vomiting  Last Vitals:  Filed Vitals:   07/20/15 1445  BP: 120/86  Pulse: 55  Temp:   Resp: 13    Post-op Vital Signs: stable   Complications: No apparent anesthesia complications

## 2015-07-20 NOTE — Transfer of Care (Signed)
Immediate Anesthesia Transfer of Care Note  Patient: Dennis Newton  Procedure(s) Performed: Procedure(s) (LRB): PENILE PLICATION (N/A)  Patient Location: PACU  Anesthesia Type: General  Level of Consciousness: awake, oriented, sedated and patient cooperative  Airway & Oxygen Therapy: Patient Spontanous Breathing and Patient connected to face mask oxygen  Post-op Assessment: Report given to PACU RN and Post -op Vital signs reviewed and stable  Post vital signs: Reviewed and stable  Complications: No apparent anesthesia complications

## 2015-07-20 NOTE — Brief Op Note (Signed)
07/20/2015  2:19 PM  PATIENT:  Daquan Zemanek  39 y.o. male  PRE-OPERATIVE DIAGNOSIS:  PEYRONIES DISEASE  POST-OPERATIVE DIAGNOSIS:  PEYRONIES DISEASE  PROCEDURE:  Procedure(s): PENILE PLICATION (N/A), penile block  SURGEON:  Surgeon(s) and Role:    * Cleon Gustin, MD - Primary  PHYSICIAN ASSISTANT:   ASSISTANTS: none   ANESTHESIA:   general  EBL:  Total I/O In: 300 [I.V.:300] Out: -   BLOOD ADMINISTERED:none  DRAINS: none   LOCAL MEDICATIONS USED:  MARCAINE     SPECIMEN:  No Specimen  DISPOSITION OF SPECIMEN:  N/A  COUNTS:  YES  TOURNIQUET:  * No tourniquets in log *  DICTATION: .Note written in EPIC  PLAN OF CARE: Discharge to home after PACU  PATIENT DISPOSITION:  PACU - hemodynamically stable.   Delay start of Pharmacological VTE agent (>24hrs) due to surgical blood loss or risk of bleeding: not applicable

## 2015-07-20 NOTE — Addendum Note (Signed)
Addendum  created 07/20/15 1537 by Suan Halter, CRNA   Modules edited: Anesthesia Medication Administration

## 2015-07-21 ENCOUNTER — Encounter (HOSPITAL_BASED_OUTPATIENT_CLINIC_OR_DEPARTMENT_OTHER): Payer: Self-pay | Admitting: Urology

## 2015-08-02 NOTE — Op Note (Signed)
Preoperative diagnosis: Peyronies disease  Postoperative diagnosis: Same  Procedure: 1. Penile plication 2. Penile block  Attending: Nicolette Bang, MD  Anesthesia: General  History of blood loss: Minimal  Antibiotics: Ancef  Drains: None  Specimens: none  Findings: 90 degree left curvature at distal penile shaft. 4 plication sutures placed. leff than 15 degree curvature at completion of operation  Indications: Patient is a 39 year old male with a history of peyronies disease with pain with erections. His disease has been stable for over 1 year.  We discussed the treatment options including observation versus injections versus plication and after discussing treatment options the patient wishes to proceed with plication.  Procedure in detail: Prior to procedure consent was obtained.  Patient was brought to the operating room and a brief timeout was done to ensure correct patient, correct procedure, correct site.  General anesthesia was administered and patient was placed in supine position.  His genitalia and abdomen was then prepped and draped in usual sterile fashion. An artificial erections was accomplished by placing a quarter inch penrose tourniquet at the base of the penis and injecting saline into the corpora with a butterfly needle. We noted 90 degree left curvature at the distal shaft.  We then release the tourniquet. A 3 cm incision was made along the distal right corpora.  We dissected due to the corpora. We used a Chartered loss adjuster for exposure. We then made 2 rows of 16 dots for our plication. We then used 2-0 Ethibond to perform our plication.  Once we placed our plication sutures we then used rubber shods to hold the sutures during the artificial erection. We then produced another artificial erection and noted that there was less than 15 degrees curvature. We then released the penrose and tied the ethabond sutures.   We then closed the subcutaneous tissues in 2 layers with 3-0  Vicryl in a running fashion.  We then loosely closed the skin with 4-0 Monocryl in a running fashion. We then injected 0.25% marcaine at the base of the pubic bone to perform the penile block.  A dressing was then applied to the incision.  This then concluded the procedure which was well tolerated by the patient.  Complications: None  Condition: Stable, extubated, transferred to PACU.  Plan: Patient is to be discharged home.  He is to follow up in 2 weeks for wound check. He will be given a prescription for valium to take QHS to prevent erections

## 2015-09-04 ENCOUNTER — Ambulatory Visit (INDEPENDENT_AMBULATORY_CARE_PROVIDER_SITE_OTHER): Payer: 59 | Admitting: Physician Assistant

## 2015-09-04 VITALS — BP 118/68 | HR 82 | Temp 99.9°F | Resp 18 | Ht 74.0 in | Wt 240.0 lb

## 2015-09-04 DIAGNOSIS — J029 Acute pharyngitis, unspecified: Secondary | ICD-10-CM | POA: Diagnosis not present

## 2015-09-04 DIAGNOSIS — R202 Paresthesia of skin: Secondary | ICD-10-CM | POA: Diagnosis not present

## 2015-09-04 DIAGNOSIS — Z6830 Body mass index (BMI) 30.0-30.9, adult: Secondary | ICD-10-CM | POA: Insufficient documentation

## 2015-09-04 DIAGNOSIS — J020 Streptococcal pharyngitis: Secondary | ICD-10-CM | POA: Diagnosis not present

## 2015-09-04 DIAGNOSIS — E669 Obesity, unspecified: Secondary | ICD-10-CM | POA: Insufficient documentation

## 2015-09-04 LAB — POCT RAPID STREP A (OFFICE): RAPID STREP A SCREEN: POSITIVE — AB

## 2015-09-04 MED ORDER — AMOXICILLIN 875 MG PO TABS: 875.0000 mg | ORAL_TABLET | Freq: Two times a day (BID) | ORAL | Status: AC

## 2015-09-04 MED ORDER — AMBULATORY NON FORMULARY MEDICATION: Status: DC

## 2015-09-04 NOTE — Progress Notes (Signed)
Patient ID: Dennis Newton, male    DOB: Dec 20, 1975, 38 y.o.   MRN: [id number]  PCP: Lamar Blinks, MD  Subjective:   Chief Complaint  Patient presents with  . Sore Throat    x2 days  . Foot Pain    left foot has been numb for the past few days    HPI Presents for evaluation of sore throat and foot numbness.  Sore throat x 2 days. Pain with speaking and swallowing. Started only on the LEFT, now also on the RIGHT. His mother and sister and brother have had tonsilitis and had to have tonsilectomy. Reports a history of frequent sore throat, but last one was 10 years ago. Subjective fever/chills. Ibuprofen helps the fever but not the pain. No nausea, vomiting. A little diarrhea yesterday. Feels achy in the muscles and joints "Like someone beat me."  Outer aspect of the LEFT foot feels numb x 1 week.  Applied a warm compresses without benefit. Describes tingling sensation, like it's "Asleep." Icy Hot without benefit. No trauma or injury. 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 No back injury. Back hurts now, with generalize body aches related to his current illness, as described above.    Review of Systems  Constitutional: Positive for fever, chills, activity change, appetite change and fatigue.  HENT: Positive for sore throat and trouble swallowing. Negative for congestion, drooling, ear pain, postnasal drip, rhinorrhea, sinus pressure, sneezing and voice change.   Eyes: Negative for pain and visual disturbance.  Respiratory: Negative for cough, chest tightness, shortness of breath and wheezing.   Cardiovascular: Negative for chest pain, palpitations and leg swelling.  Gastrointestinal: Positive for diarrhea. Negative for nausea, vomiting, abdominal pain and constipation.  Genitourinary: Negative for dysuria, urgency, frequency, hematuria and flank pain.  Musculoskeletal: Positive for myalgias and arthralgias.  Skin: Negative for rash.  Neurological: Positive for  weakness (generalized) and numbness (LEFT lateral foot). Negative for dizziness and headaches.  Hematological: Adenopathy: neck.       Patient Active Problem List   Diagnosis Date Noted  . Pain with ejaculation 02/10/2015  . Rectal bleeding 02/09/2015  . Upper abdominal pain 02/09/2015  . Gas 02/09/2015     Prior to Admission medications   Medication Sig Start Date End Date Taking? Authorizing Provider  ibuprofen (ADVIL,MOTRIN) 800 MG tablet TK 1 T PO TID AFTER MEALS 06/22/15   Historical Provider, MD     No Known Allergies     Objective:  Physical Exam  Constitutional: He is oriented to person, place, and time. Vital signs are normal. He appears well-developed and well-nourished. He is active and cooperative. No distress.  BP 118/68 mmHg  Pulse 82  Temp(Src) 99.9 F (37.7 C) (Oral)  Resp 18  Ht 6\' 2"  (1.88 m)  Wt 240 lb (108.863 kg)  BMI 30.80 kg/m2  SpO2 95%  HENT:  Head: Normocephalic and atraumatic.  Right Ear: Hearing, tympanic membrane, external ear and ear canal normal.  Left Ear: Hearing, tympanic membrane, external ear and ear canal normal.  Nose: Mucosal edema present. No rhinorrhea, nose lacerations, sinus tenderness, nasal deformity or nasal septal hematoma. No epistaxis.  No foreign bodies.  Mouth/Throat: Uvula is midline and mucous membranes are normal. He does not have dentures. No uvula swelling. Oropharyngeal exudate, posterior oropharyngeal edema and posterior oropharyngeal erythema present. No tonsillar abscesses.  Eyes: Conjunctivae are normal. No scleral icterus.  Neck: Normal range of motion and phonation normal. Neck supple. No thyromegaly present.  Cardiovascular: Normal rate, regular  rhythm and normal heart sounds.   Pulses:      Radial pulses are 2+ on the right side, and 2+ on the left side.  Pulmonary/Chest: Effort normal and breath sounds normal.  Musculoskeletal:       Left ankle: Normal. Achilles tendon normal.       Left foot: There is  normal range of motion, no tenderness, no bony tenderness, no swelling, normal capillary refill, no crepitus, no deformity and no laceration.       Feet:  Lymphadenopathy:       Head (right side): Tonsillar adenopathy present. No preauricular, no posterior auricular and no occipital adenopathy present.       Head (left side): Tonsillar adenopathy present. No preauricular, no posterior auricular and no occipital adenopathy present.    He has no cervical adenopathy.       Right: No supraclavicular adenopathy present.       Left: No supraclavicular adenopathy present.  Neurological: He is alert and oriented to person, place, and time. No sensory deficit.  Skin: Skin is warm, dry and intact. No rash noted. No cyanosis or erythema. Nails show no clubbing.  Psychiatric: He has a normal mood and affect. His speech is normal and behavior is normal.    Results for orders placed or performed in visit on 09/04/15  POCT rapid strep A  Result Value Ref Range   Rapid Strep A Screen Positive (A) Negative          Assessment & Plan:   1. Sore throat - POCT rapid strep A  2. Streptococcal sore throat Anticipatory guidance, supportive care. RTC if not significantly improved in the next 24-48 hours, sooner if worsens. - amoxicillin (AMOXIL) 875 MG tablet; Take 1 tablet (875 mg total) by mouth 2 (two) times daily.  Dispense: 20 tablet; Refill: 0 - AMBULATORY NON FORMULARY MEDICATION; MIX: 90 ml Cherry Mylanta, 90 ml Cherry Benadryl, 30 ml Cherry Hurricane liquid.  5-10 ml PO swish and spit or swallow, Q2 hours, prn sore throat.  Dispense: 210 mL; Refill: 1  3. Paresthesia Unclear etiology. Likely mechanical. He'll observe his leg and foot position in the vehicle over the next few weeks. As there is no pain or swelling, it's OK to monitor for now, but he should return if those develop or if he notes the area is growing in size or if he develops new symptoms.   Fara Chute, PA-C Physician  [facility]istant[facility]

## 2015-09-04 NOTE — Patient Instructions (Signed)

## 2015-10-14 ENCOUNTER — Ambulatory Visit (INDEPENDENT_AMBULATORY_CARE_PROVIDER_SITE_OTHER): Payer: 59 | Admitting: Urgent Care

## 2015-10-14 VITALS — BP 124/80 | HR 66 | Temp 99.5°F | Resp 17 | Ht 73.5 in | Wt 245.0 lb

## 2015-10-14 DIAGNOSIS — M25451 Effusion, right hip: Secondary | ICD-10-CM

## 2015-10-14 DIAGNOSIS — L509 Urticaria, unspecified: Secondary | ICD-10-CM | POA: Diagnosis not present

## 2015-10-14 DIAGNOSIS — L505 Cholinergic urticaria: Secondary | ICD-10-CM | POA: Diagnosis not present

## 2015-10-14 DIAGNOSIS — Z23 Encounter for immunization: Secondary | ICD-10-CM

## 2015-10-14 LAB — POCT CBC
GRANULOCYTE PERCENT: 44.4 % (ref 37–80)
HCT, POC: 40.4 % — AB (ref 43.5–53.7)
Hemoglobin: 14.4 g/dL (ref 14.1–18.1)
LYMPH, POC: 2.3 (ref 0.6–3.4)
MCH, POC: 30.2 pg (ref 27–31.2)
MCHC: 35.6 g/dL — AB (ref 31.8–35.4)
MCV: 84.9 fL (ref 80–97)
MID (CBC): 0.5 (ref 0–0.9)
MPV: 7.5 fL (ref 0–99.8)
PLATELET COUNT, POC: 218 10*3/uL (ref 142–424)
POC Granulocyte: 2.2 (ref 2–6.9)
POC LYMPH %: 46.2 % (ref 10–50)
POC MID %: 9.4 %M (ref 0–12)
RBC: 4.75 M/uL (ref 4.69–6.13)
RDW, POC: 13.1 %
WBC: 4.9 10*3/uL (ref 4.6–10.2)

## 2015-10-14 MED ORDER — CETIRIZINE HCL 10 MG PO TABS: 10.0000 mg | ORAL_TABLET | Freq: Every day | ORAL | Status: DC

## 2015-10-14 MED ORDER — MONTELUKAST SODIUM 10 MG PO TABS: 10.0000 mg | ORAL_TABLET | Freq: Every day | ORAL | Status: DC

## 2015-10-14 NOTE — Progress Notes (Signed)
    MRN: [id number] DOB: [birth year]  Subjective:   Dennis Newton is a 39 y.o. male presenting for chief complaint of rash on legs of unknown origin and Immunizations  Reports ~4 month history of hives, itching and swelling of his thighs bilaterally associated with exercise only. Has not tried any medications for relief. Denies fever, shob, chest tightness, n/v, abdominal pain, new cleaning agents or soaps, new foods, time spent outdoors, insect or tick bites, night sweats or weight loss. He presents today after doing a workout and had subsequent redness and itching.  [facility] currently has no medications in their medication list. Also has No Known Allergies.  Nayib  has a past medical history of Peyronie's disease; Urethral stricture; Dysuria; Renal cyst, right; and Internal hemorrhoids. Also  has past surgical history that includes Colonoscopy with esophagogastroduodenoscopy (egd) (04-07-2015  propofol) and Penile biopsy (N/A, 07/20/2015).  Objective:   Vitals: BP 124/80 mmHg  Pulse 66  Temp(Src) 99.5 F (37.5 C) (Oral)  Resp 17  Ht 6' 1.5" (1.867 m)  Wt 245 lb (111.131 kg)  BMI 31.88 kg/m2  SpO2 99%  Physical Exam  Constitutional: He is oriented to person, place, and time. He appears well-developed and well-nourished.  Cardiovascular: Normal rate, normal heart sounds and intact distal pulses.  Exam reveals no gallop and no friction rub.   No murmur heard. Pulmonary/Chest: No respiratory distress. He has no wheezes. He has no rales.  Musculoskeletal:       Right knee: He exhibits swelling (trace edema) and erythema (slight erythema over urticarial lesions). He exhibits normal range of motion, no effusion, no ecchymosis, no deformity and no laceration. No tenderness found.       Left knee: He exhibits swelling (trace edema) and erythema (slight erythema over urticarial lesions). He exhibits normal range of motion, no effusion, no ecchymosis, no deformity and no laceration. No  tenderness found.  Neurological: He is alert and oriented to person, place, and time.  Skin: Skin is warm and dry. Rash (urticarial lesions over his thighs anteriorly) noted. No erythema. No pallor.   Assessment and Plan :   1. Need for prophylactic vaccination and inoculation against influenza - Flu Vaccine QUAD 36+ mos IM  2. Cholinergic urticaria 3. Urticaria 4. Swelling of joint of pelvic region or thigh, right 5. Swelling of joint of pelvic region or thigh, left - Labs pending, start antihistamine, avoid exercise until we can resolve his symptoms. Patient to restart light exercise thereafter. Consider mast cell stabilizer if no improvement in 3-4 weeks. Patient agreed.  Jaynee Eagles, PA-C [facility] [phone] 10/14/2015 11:43 AM

## 2015-10-14 NOTE — Patient Instructions (Signed)
Cholinergic Urticaria  Author: Huel Coventry, MD, MPH; Chief Editor: [place] Service, MD  more... Approach Considerations Traditional treatment options for cholinergic urticaria are antihistamines, leukotriene inhibitors, and immunosuppressives. [22, 23] However, cholinergic urticaria in some patients may be refractory. Sometimes, an attack of cholinergic urticaria can be aborted by rapid cooling. Ultraviolet (UV) light has been beneficial in some patients with the condition, but one must be circumspect about contraindications to UV light. Rapid desensitization with autologous sweat has been reported in patients resistant to conventional therapy who have sweat hypersensitivity. [24] In evaluating any response to therapy, one must always consider that cholinergic urticaria can clear spontaneously. Also see the clinical guideline summary from the [place] [employer], Guidelines for Evaluation and Management of Urticaria in Adults and Children. Diet  Modifying one's diet may be helpful because cholinergic urticaria attacks can sometimes result from hot foods and beverages, highly spiced foods, and alcohol. Deterrence/prevention  Patients with cholinergic urticaria should avoid the precipitating factors. These factors, in some persons, include exercise and any activity that causes sweating, such as elevated environmental temperature, hot food, sauna baths, immersion in hot water, gustatory stimuli, emotional stress, and hemodialysis.

## 2015-10-15 ENCOUNTER — Encounter: Payer: Self-pay | Admitting: Urgent Care

## 2015-10-15 LAB — SEDIMENTATION RATE: Sed Rate: 9 mm/hr (ref 0–15)

## 2015-10-15 LAB — EOSINOPHIL COUNT: Eosinophils Absolute: 0.2 10*3/uL (ref 0.0–0.7)

## 2015-11-20 ENCOUNTER — Encounter: Payer: Self-pay | Admitting: Family Medicine

## 2015-11-25 ENCOUNTER — Encounter: Payer: Self-pay | Admitting: Family Medicine

## 2016-03-04 ENCOUNTER — Ambulatory Visit (INDEPENDENT_AMBULATORY_CARE_PROVIDER_SITE_OTHER): Payer: BLUE CROSS/BLUE SHIELD | Admitting: Family Medicine

## 2016-03-04 VITALS — BP 130/92 | HR 68 | Temp 98.1°F | Resp 16 | Ht 73.5 in | Wt 243.4 lb

## 2016-03-04 DIAGNOSIS — K120 Recurrent oral aphthae: Secondary | ICD-10-CM

## 2016-03-04 MED ORDER — TRIAMCINOLONE ACETONIDE 0.1 % MT PSTE: 1.0000 "application " | PASTE | Freq: Three times a day (TID) | OROMUCOSAL | Status: DC

## 2016-03-04 NOTE — Patient Instructions (Addendum)
Thank you for coming in today.  Apply the paste three times daily.  Return as needed.  Follow up with a dentist or oral doctor if worsening.  You could be seen by [facility].     Oral Ulcers Oral ulcers are painful, shallow sores around the lining of the mouth. They can affect the gums, the inside of the lips, and the cheeks. (Sores on the outside of the lips and on the face are different.) They typically first occur in school-aged children and teenagers. Oral ulcers may also be called canker sores or cold sores. CAUSES  Canker sores and cold sores can be caused by many factors including:  Infection.  Injury.  Sun exposure.  Medications.  Emotional stress.  Food allergies.  Vitamin deficiencies.  Toothpastes containing sodium lauryl sulfate. The herpes virus can be the cause of mouth ulcers. The first infection can be severe and cause 10 or more ulcers on the gums, tongue, and lips with fever and difficulty in swallowing. This infection usually occurs between the ages of 68 and 3 years.  SYMPTOMS  The typical sore is about  inch (6 mm) in size and is an oval or round ulcer with red borders. DIAGNOSIS  Your caregiver can diagnose simple oral ulcers by examination. Additional testing is usually not required.  TREATMENT  Treatment is aimed at pain relief. Generally, oral ulcers resolve by themselves within 1 to 2 weeks without medication and are not contagious unless caused by herpes (and other viruses). Antibiotics are not effective with mouth sores. Avoid direct contact with others until the ulcer is completely healed. See your caregiver for follow-up care as recommended. Also:  Offer a soft diet.  Encourage plenty of fluids to prevent dehydration. Popsicles and milk shakes can be helpful.  Avoid acidic and salty foods and drinks such as orange juice.  Infants and young children will often refuse to drink because of pain. Using a teaspoon, cup, or  syringe to give small amounts of fluids frequently can help prevent dehydration.  Cold compresses on the face may help reduce pain.  Pain medication can help control soreness.  A solution of diphenhydramine mixed with a liquid antacid can be useful to decrease the soreness of ulcers. Consult a caregiver for the dosing.  Liquids or ointments with a numbing ingredient may be helpful when used as recommended.  Older children and teenagers can rinse their mouth with a salt-water mixture (1/2 teaspoon of salt in 8 ounces of water) four times a day. This treatment is uncomfortable but may reduce the time the ulcers are present.  There are many over-the-counter throat lozenges and medications available for oral ulcers. Their effectiveness has not been studied.  Consult your medical caregiver prior to using homeopathic treatments for oral ulcers. SEEK MEDICAL CARE IF:   You think your child needs to be seen.  The pain worsens and you cannot control it.  There are 4 or more ulcers.  The lips and gums begin to bleed and crust.  A single mouth ulcer is near a tooth that is causing a toothache or pain.  Your child has a fever, swollen face, or swollen glands.  The ulcers began after starting a medication.  Mouth ulcers keep reoccurring or last more than 2 weeks.  You think your child is not taking adequate fluids. SEEK IMMEDIATE MEDICAL CARE IF:   Your child has a high fever.  Your child is unable to swallow or becomes dehydrated.  Your  child looks or acts very ill.  An ulcer caused by a chemical your child accidentally put in their mouth.   This information is not intended to replace advice given to you by your health care provider. Make sure you discuss any questions you have with your health care provider.   Document Released: 11/24/2004 Document Revised: 11/07/2014 Document Reviewed: 03/04/2015 Elsevier Interactive Patient Education 2016 [place] Canker  sores are small, painful sores that develop inside your mouth. They may also be called aphthous ulcers. You can get canker sores on the inside of your lips or cheeks, on your tongue, or anywhere inside your mouth. You can have just one canker sore or several of them. Canker sores cannot be passed from one person to another (noncontagious). These sores are different than the sores that you may get on the outside of your lips (cold sores or fever blisters). Canker sores usually start as painful red bumps. Then they turn into small white, yellow, or gray ulcers that have red borders. The ulcers may be quite painful. The pain may be worse when you eat or drink. CAUSES The cause of this condition is not known. RISK FACTORS This condition is more likely to develop in:  Women.  People in their teens or 81s.  Women who are having their menstrual period.  People who are under a lot of emotional stress.  People who do not get enough iron or B vitamins.  People who have poor oral hygiene.  People who have an injury inside the mouth. This can happen after having dental work or from chewing something hard. SYMPTOMS Along with the canker sore, symptoms may also include:  Fever.  Fatigue.  Swollen lymph nodes in your neck. DIAGNOSIS This condition can be diagnosed based on your symptoms. Your health care provider will also examine your mouth. Your health care provider may also do tests if you get canker sores often or if they are very bad. Tests may include:  Blood tests to rule out other causes of canker sores.  Taking swabs from the sore to check for infection.  Taking a small piece of skin from the sore (biopsy) to test it for cancer. TREATMENT Most canker sores clear up without treatment in about 10 days. Home care is usually the only treatment that you will need. Over-the-counter medicines can relieve discomfort.If you have severe canker sores, your health care provider may  prescribe:  Numbing ointment to relieve pain.  Vitamins.  Steroid medicines. These may be given as:  Oral pills.  Mouth rinses.  Gels.  Antibiotic mouth rinse. HOME CARE INSTRUCTIONS  Apply, take, or use medicines only as directed by your health care provider. These include vitamins.  If you were prescribed an antibiotic mouth rinse, finish all of it even if you start to feel better.  Until the sores are healed:  Do not drink coffee or citrus juices.  Do not eat spicy or salty foods.  Use a mild, over-the-counter mouth rinse as directed by your health care provider.  Practice good oral hygiene.  Floss your teeth every day.  Brush your teeth with a soft brush twice each day. SEEK MEDICAL CARE IF:  Your symptoms do not get better after two weeks.  You also have a fever or swollen glands.  You get canker sores often.  You have a canker sore that is getting larger.  You cannot eat or drink due to your canker sores.   This information is  not intended to replace advice given to you by your health care provider. Make sure you discuss any questions you have with your health care provider.   Document Released: 02/11/2011 Document Revised: 03/03/2015 Document Reviewed: 09/17/2014 Elsevier Interactive Patient Education 2016 [employer].    IF you received an x-ray today, you will receive an invoice from [facility]. Please contact [facility] at [phone] with questions or concerns regarding your invoice.   IF you received labwork today, you will receive an invoice from [employer]. Please contact [facility] at [phone] with questions or concerns regarding your invoice.   Our billing staff will not be able to assist you with questions regarding bills from these companies.  You will be contacted with the lab results as soon as they are available. The fastest way to get your results is to activate your My Chart account.  Instructions are located on the last page of this paperwork. If you have not heard from [place] regarding the results in 2 weeks, please contact this office.

## 2016-03-04 NOTE — Progress Notes (Signed)
Derron Fragale is a 40 y.o. male who presents to Urgent Care today for sore under her tongue. Patient has a 5 day history of painful sore under his tongue. He notes a small white ulceration with tenderness. He has not tried any medicines. No fevers chills nausea vomiting or diarrhea. He feels well otherwise.   Past Medical History  Diagnosis Date  . Peyronie's disease   . Urethral stricture   . Dysuria   . Renal cyst, right   . Internal hemorrhoids    Past Surgical History  Procedure Laterality Date  . Colonoscopy with esophagogastroduodenoscopy (egd)  04-07-2015  propofol  . Penile biopsy N/A 07/20/2015    Procedure: PENILE PLICATION;  Surgeon: Cleon Gustin, MD;  Location: [facility];  Service: Urology;  Laterality: N/A;   Social History  Substance Use Topics  . Smoking status: Never Smoker   . Smokeless tobacco: Never Used  . Alcohol Use: No   ROS as above Medications: Current Outpatient Prescriptions  Medication Sig Dispense Refill  . cetirizine (ZYRTEC) 10 MG tablet Take 1 tablet (10 mg total) by mouth daily. (Patient not taking: Reported on 03/04/2016) 30 tablet 11  . triamcinolone (KENALOG) 0.1 % paste Use as directed 1 application in the mouth or throat 3 (three) times daily. 5 g 1   No current facility-administered medications for this visit.   No Known Allergies   Exam:  BP 130/92 mmHg  Pulse 68  Temp(Src) 98.1 F (36.7 C)  Resp 16  Ht 6' 1.5" (1.867 m)  Wt 243 lb 6.4 oz (110.406 kg)  BMI 31.67 kg/m2  SpO2 100% Gen: Well NAD HEENT: EOMI,  MMM Small shallow erythematous tender ulcer under the midline tongue. Lungs: Normal work of breathing. CTABL Heart: RRR no MRG Abd: NABS, Soft. Nondistended, Nontender Exts: Brisk capillary refill, warm and well perfused.   No results found for this or any previous visit (from the past 24 hour(s)). No results found.  Assessment and Plan: 40 y.o. male with canker sore likely. Treat with  triamcinolone oral paste. Watchful waiting return as needed.  Discussed warning signs or symptoms. Please see discharge instructions. Patient expresses understanding.

## 2016-08-29 ENCOUNTER — Ambulatory Visit (INDEPENDENT_AMBULATORY_CARE_PROVIDER_SITE_OTHER): Payer: BLUE CROSS/BLUE SHIELD

## 2016-08-29 ENCOUNTER — Ambulatory Visit (INDEPENDENT_AMBULATORY_CARE_PROVIDER_SITE_OTHER): Payer: BLUE CROSS/BLUE SHIELD | Admitting: Family Medicine

## 2016-08-29 VITALS — BP 113/74 | HR 69 | Temp 97.9°F | Resp 16 | Ht 73.0 in | Wt 248.0 lb

## 2016-08-29 DIAGNOSIS — Z23 Encounter for immunization: Secondary | ICD-10-CM

## 2016-08-29 DIAGNOSIS — M25531 Pain in right wrist: Secondary | ICD-10-CM

## 2016-08-29 DIAGNOSIS — S63501A Unspecified sprain of right wrist, initial encounter: Secondary | ICD-10-CM | POA: Diagnosis not present

## 2016-08-29 MED ORDER — MELOXICAM 15 MG PO TABS: 15.0000 mg | ORAL_TABLET | Freq: Every day | ORAL | 0 refills | Status: DC

## 2016-08-29 NOTE — Progress Notes (Signed)
Subjective:    Patient ID: Dennis Newton, male    DOB: 1976/07/01, 40 y.o.   MRN: [id number]  08/29/2016  Wrist Pain (right, x 1 week) and Immunizations (tdap)   HPI This 40 y.o. male presents for evaluation of R wrist pain. Onset one week ago.  Ulnar aspect of wrist is painful.  No trauma recently. Suffered an injury to R wrist as a child; suffered fracture with abnormal healing; chronic swelling or abnormal bony prominence to R wrist since injury as child.  No n/t/w in R hand.  R hand dominant.     Review of Systems  Constitutional: Negative for chills, diaphoresis, fatigue and fever.  Musculoskeletal: Positive for arthralgias, joint swelling and myalgias. Negative for neck pain and neck stiffness.  Skin: Negative for color change, pallor, rash and wound.  Neurological: Negative for weakness and numbness.    Past Medical History:  Diagnosis Date  . Dysuria   . Internal hemorrhoids   . Peyronie's disease   . Renal cyst, right   . Urethral stricture    Past Surgical History:  Procedure Laterality Date  . COLONOSCOPY WITH ESOPHAGOGASTRODUODENOSCOPY (EGD)  04-07-2015  propofol  . PENILE BIOPSY N/A 07/20/2015   Procedure: PENILE PLICATION;  Surgeon: Cleon Gustin, MD;  Location: [facility];  Service: Urology;  Laterality: N/A;   No Known Allergies Current Outpatient Prescriptions  Medication Sig Dispense Refill  . meloxicam (MOBIC) 15 MG tablet Take 1 tablet (15 mg total) by mouth daily. 30 tablet 0   No current facility-administered medications for this visit.    Social History   Social History  . Marital status: Married    Spouse name: Barrister's clerk  . Number of children: 4  . Years of education: 11th grade   Occupational History  . driver    Social History Main Topics  . Smoking status: Never Smoker  . Smokeless tobacco: Never Used  . Alcohol use No  . Drug use: No  . Sexual activity: Not on file   Other Topics Concern  . Not on  file   Social History Narrative   From [place], in [place]. Came to the [place] in 2002.   Lives with his wife and their 4 children.   No family history on file.     Objective:    BP 113/74 (BP Location: Right Arm, Patient Position: Sitting, Cuff Size: Large)   Pulse 69   Temp 97.9 F (36.6 C) (Oral)   Resp 16   Ht 6\' 1"  (1.854 m)   Wt 248 lb (112.5 kg)   SpO2 99%   BMI 32.72 kg/m  Physical Exam  Constitutional: He is oriented to person, place, and time. He appears well-developed and well-nourished. No distress.  HENT:  Head: Normocephalic and atraumatic.  Eyes: Conjunctivae and EOM are normal. Pupils are equal, round, and reactive to light.  Neck: Normal range of motion. Neck supple. Carotid bruit is not present. No thyromegaly present.  Cardiovascular: Normal rate, regular rhythm, normal heart sounds and intact distal pulses.  Exam reveals no gallop and no friction rub.   No murmur heard. Pulmonary/Chest: Effort normal and breath sounds normal. He has no wheezes. He has no rales.  Musculoskeletal:       Right elbow: Normal.He exhibits no swelling.       Right wrist: He exhibits swelling. He exhibits normal range of motion, no tenderness and no bony tenderness.       Right forearm: Normal. He  exhibits no tenderness, no bony tenderness and no swelling.       Right hand: Normal. He exhibits normal range of motion, no tenderness, no bony tenderness, normal capillary refill and no deformity. Normal sensation noted. Normal strength noted.  R wrist: +bony prominence of wrist; painful ROM along ulnar distribution with movement; full extension/flexion/supination/pronation of wrist.  Grip 5/5.   Lymphadenopathy:    He has no cervical adenopathy.  Neurological: He is alert and oriented to person, place, and time. No cranial nerve deficit.  Skin: Skin is warm and dry. No rash noted. He is not diaphoretic.  Psychiatric: He has a normal mood and affect. His behavior is normal.  Nursing note  and vitals reviewed.  Dg Wrist Complete Right  Result Date: 08/29/2016 CLINICAL DATA:  40 year old male with ulnar aspect right wrist pain. No recent injury. Reports wrist fracture as a child. Initial encounter. EXAM: RIGHT WRIST - COMPLETE 3+ VIEW COMPARISON:  None. FINDINGS: Overall bone mineralization is within normal limits. The distal right radius appears normal. There is a chronic appearing deformity of the distal right ulna with radial angulation, cortical irregularity, and a tiny ossific fragment near the chronically deformed ulnar styloid. The carpal bone alignment appears to remain normal. Carpal joint spaces are within normal limits. Metacarpals appear intact. No acute osseous abnormality identified. Mild dystrophic calcifications in the distal third right forearm. IMPRESSION: 1.  No acute osseous abnormality identified about the right wrist. 2. 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 Electronically Signed   By: Genevie Ann M.D.   On: 08/29/2016 13:21       Assessment & Plan:   1. Right wrist sprain, initial encounter   2. Right wrist pain   3. Need for Tdap vaccination    -New wrist sprain; recommend rest, icing.  Prescribed  Meloxicam to take daily; also prescribed wrist splint to wear during the day for the next 1-2 weeks; remove splint in evenings and perform passive ROM. If no improvement in 2-4 weeks, call for ortho referral. -s/p tDAP   Orders Placed This Encounter  Procedures  . DG Wrist Complete Right    Standing Status:   Future    Number of Occurrences:   1    Standing Expiration Date:   08/29/2017    Order Specific Question:   Reason for Exam (SYMPTOM  OR DIAGNOSIS REQUIRED)    Answer:   R wrist pain ulnar aspect; no injury recent; history of fracture as child    Order Specific Question:   Preferred imaging location?    Answer:   External  . Tdap vaccine greater than or equal to 7yo IM  . Splint wrist    velcro style; RIGHT   Meds ordered this  encounter  Medications  . meloxicam (MOBIC) 15 MG tablet    Sig: Take 1 tablet (15 mg total) by mouth daily.    Dispense:  30 tablet    Refill:  0    No Follow-up on file.    Elayne Guerin, M.D. [facility] [address] [place], [place]  [id number] [phone] phone [phone] fax

## 2016-08-29 NOTE — Patient Instructions (Addendum)
IF you received an x-ray today, you will receive an invoice from [facility]. Please contact [facility] at [phone] with questions or concerns regarding your invoice.   IF you received labwork today, you will receive an invoice from [employer]. Please contact [facility] at [phone] with questions or concerns regarding your invoice.   Our billing staff will not be able to assist you with questions regarding bills from these companies.  You will be contacted with the lab results as soon as they are available. The fastest way to get your results is to activate your My Chart account. Instructions are located on the last page of this paperwork. If you have not heard from [place] regarding the results in 2 weeks, please contact this office.      Wrist Sprain With Rehab A sprain is an injury in which a ligament that maintains the proper alignment of a joint is partially or completely torn. The ligaments of the wrist are susceptible to sprains. Sprains are classified into three categories. Grade 1 sprains cause pain, but the tendon is not lengthened. Grade 2 sprains include a lengthened ligament because the ligament is stretched or partially ruptured. With grade 2 sprains there is still function, although the function may be diminished. Grade 3 sprains are characterized by a complete tear of the tendon or muscle, and function is usually impaired. SYMPTOMS   Pain tenderness, inflammation, and/or bruising (contusion) of the injury.  A "pop" or tear felt and/or heard at the time of injury.  Decreased wrist function. CAUSES  A wrist sprain occurs when a force is placed on one or more ligaments that is greater than it/they can withstand. Common mechanisms of injury include:  Catching a ball with your hands.  Repetitive and/ or strenuous extension or flexion of the wrist. RISK INCREASES WITH:  Previous wrist injury.  Contact sports (boxing or  wrestling).  Activities in which falling is common.  Poor strength and flexibility.  Improperly fitted or padded protective equipment. PREVENTION  Warm up and stretch properly before activity.  Allow for adequate recovery between workouts.  Maintain physical fitness:  Strength, flexibility, and endurance.  Cardiovascular fitness.  Protect the wrist joint by limiting its motion with the use of taping, braces, or splints.  Protect the wrist after injury for 6 to 12 months. PROGNOSIS  The prognosis for wrist sprains depends on the degree of injury. Grade 1 sprains require 2 to 6 weeks of treatment. Grade 2 sprains require 6 to 8 weeks of treatment, and grade 3 sprains require up to 12 weeks.  RELATED COMPLICATIONS   Prolonged healing time, if improperly treated or re-injured.  Recurrent symptoms that result in a chronic problem.  Injury to nearby structures (bone, cartilage, nerves, or tendons).  Arthritis of the wrist.  Inability to compete in athletics at a high level.  Wrist stiffness or weakness.  Progression to a complete rupture of the ligament. TREATMENT  Treatment initially involves resting from any activities that aggravate the symptoms, and the use of ice and medications to help reduce pain and inflammation. Your caregiver may recommend immobilizing the wrist for a period of time in order to reduce stress on the ligament and allow for healing. After immobilization it is important to perform strengthening and stretching exercises to help regain strength and a full range of motion. These exercises may be completed at home or with a therapist. Surgery is not usually required for wrist sprains, unless the ligament has  been ruptured (grade 3 sprain). MEDICATION   If pain medication is necessary, then nonsteroidal anti-inflammatory medications, such as aspirin and ibuprofen, or other minor pain relievers, such as acetaminophen, are often recommended.  Do not take pain  medication for 7 days before surgery.  Prescription pain relievers may be given if deemed necessary by your caregiver. Use only as directed and only as much as you need. HEAT AND COLD  Cold treatment (icing) relieves pain and reduces inflammation. Cold treatment should be applied for 10 to 15 minutes every 2 to 3 hours for inflammation and pain and immediately after any activity that aggravates your symptoms. Use ice packs or massage the area with a piece of ice (ice massage).  Heat treatment may be used prior to performing the stretching and strengthening activities prescribed by your caregiver, physical therapist, or athletic trainer. Use a heat pack or soak your injury in warm water. SEEK MEDICAL CARE IF:  Treatment seems to offer no benefit, or the condition worsens.  Any medications produce adverse side effects. EXERCISES RANGE OF MOTION (ROM) AND STRETCHING EXERCISES - Wrist Sprain  These exercises may help you when beginning to rehabilitate your injury. Your symptoms may resolve with or without further involvement from your physician, physical therapist or athletic trainer. While completing these exercises, remember:   Restoring tissue flexibility helps normal motion to return to the joints. This allows healthier, less painful movement and activity.  An effective stretch should be held for at least 30 seconds.  A stretch should never be painful. You should only feel a gentle lengthening or release in the stretched tissue. RANGE OF MOTION - Wrist Flexion, Active-Assisted  Extend your right / left elbow with your fingers pointing down.*  Gently pull the back of your hand towards you until you feel a gentle stretch on the top of your forearm.  Hold this position for __________ seconds. Repeat __________ times. Complete this exercise __________ times per day.  *If directed by your physician, physical therapist or athletic trainer, complete this stretch with your elbow bent rather  than extended. RANGE OF MOTION - Wrist Extension, Active-Assisted  Extend your right / left elbow and turn your palm upwards.*  Gently pull your palm/fingertips back so your wrist extends and your fingers point more toward the ground.  You should feel a gentle stretch on the inside of your forearm.  Hold this position for __________ seconds. Repeat __________ times. Complete this exercise __________ times per day. *If directed by your physician, physical therapist or athletic trainer, complete this stretch with your elbow bent, rather than extended. RANGE OF MOTION - Supination, Active  Stand or sit with your elbows at your side. Bend your right / left elbow to 90 degrees.  Turn your palm upward until you feel a gentle stretch on the inside of your forearm.  Hold this position for __________ seconds. Slowly release and return to the starting position. Repeat __________ times. Complete this stretch __________ times per day.  RANGE OF MOTION - Pronation, Active  Stand or sit with your elbows at your side. Bend your right / left elbow to 90 degrees.  Turn your palm downward until you feel a gentle stretch on the top of your forearm.  Hold this position for __________ seconds. Slowly release and return to the starting position. Repeat __________ times. Complete this stretch __________ times per day.  STRETCH - Wrist Flexion  Place the back of your right / left hand on a tabletop leaving your elbow  slightly bent. Your fingers should point away from your body.  Gently press the back of your hand down onto the table by straightening your elbow. You should feel a stretch on the top of your forearm.  Hold this position for __________ seconds. Repeat __________ times. Complete this stretch __________ times per day.  STRETCH - Wrist Extension  Place your right / left fingertips on a tabletop leaving your elbow slightly bent. Your fingers should point backwards.  Gently press your fingers  and palm down onto the table by straightening your elbow. You should feel a stretch on the inside of your forearm.  Hold this position for __________ seconds. Repeat __________ times. Complete this stretch __________ times per day.  STRENGTHENING EXERCISES - Wrist Sprain These exercises may help you when beginning to rehabilitate your injury. They may resolve your symptoms with or without further involvement from your physician, physical therapist or athletic trainer. While completing these exercises, remember:   Muscles can gain both the endurance and the strength needed for everyday activities through controlled exercises.  Complete these exercises as instructed by your physician, physical therapist or athletic trainer. Progress with the resistance and repetition exercises only as your caregiver advises. STRENGTH - Wrist Flexors  Sit with your right / left forearm palm-up and fully supported. Your elbow should be resting below the height of your shoulder. Allow your wrist to extend over the edge of the surface.  Loosely holding a __________ weight or a piece of rubber exercise band/tubing, slowly curl your hand up toward your forearm.  Hold this position for __________ seconds. Slowly lower the wrist back to the starting position in a controlled manner. Repeat __________ times. Complete this exercise __________ times per day.  STRENGTH - Wrist Extensors  Sit with your right / left forearm palm-down and fully supported. Your elbow should be resting below the height of your shoulder. Allow your wrist to extend over the edge of the surface.  Loosely holding a __________ weight or a piece of rubber exercise band/tubing, slowly curl your hand up toward your forearm.  Hold this position for __________ seconds. Slowly lower the wrist back to the starting position in a controlled manner. Repeat __________ times. Complete this exercise __________ times per day.  STRENGTH - Ulnar Deviators  Stand  with a ____________________ weight in your right / left hand, or sit holding on to the rubber exercise band/tubing with your opposite arm supported.  Move your wrist so that your pinkie travels toward your forearm and your thumb moves away from your forearm.  Hold this position for __________ seconds and then slowly lower the wrist back to the starting position. Repeat __________ times. Complete this exercise __________ times per day STRENGTH - Radial Deviators  Stand with a ____________________ weight in your  right / left hand, or sit holding on to the rubber exercise band/tubing with your arm supported.  Raise your hand upward in front of you or pull up on the rubber tubing.  Hold this position for __________ seconds and then slowly lower the wrist back to the starting position. Repeat __________ times. Complete this exercise __________ times per day. STRENGTH - Forearm Supinators  Sit with your right / left forearm supported on a table, keeping your elbow below shoulder height. Rest your hand over the edge, palm down.  Gently grip a hammer or a soup ladle.  Without moving your elbow, slowly turn your palm and hand upward to a "thumbs-up" position.  Hold this position for  __________ seconds. Slowly return to the starting position. Repeat __________ times. Complete this exercise __________ times per day.  STRENGTH - Forearm Pronators  Sit with your right / left forearm supported on a table, keeping your elbow below shoulder height. Rest your hand over the edge, palm up.  Gently grip a hammer or a soup ladle.  Without moving your elbow, slowly turn your palm and hand upward to a "thumbs-up" position.  Hold this position for __________ seconds. Slowly return to the starting position. Repeat __________ times. Complete this exercise __________ times per day.  STRENGTH - Grip  Grasp a tennis ball, a dense sponge, or a large, rolled sock in your hand.  Squeeze as hard as you can  without increasing any pain.  Hold this position for __________ seconds. Release your grip slowly. Repeat __________ times. Complete this exercise __________ times per day.    This information is not intended to replace advice given to you by your health care provider. Make sure you discuss any questions you have with your health care provider.   Document Released: 10/17/2005 Document Revised: 07/08/2015 Document Reviewed: 01/29/2009 Elsevier Interactive Patient Education [employer].

## 2016-11-04 ENCOUNTER — Encounter (HOSPITAL_COMMUNITY): Payer: Self-pay | Admitting: *Deleted

## 2016-11-04 DIAGNOSIS — Y998 Other external cause status: Secondary | ICD-10-CM | POA: Insufficient documentation

## 2016-11-04 DIAGNOSIS — N486 Induration penis plastica: Secondary | ICD-10-CM | POA: Insufficient documentation

## 2016-11-04 DIAGNOSIS — Y929 Unspecified place or not applicable: Secondary | ICD-10-CM | POA: Insufficient documentation

## 2016-11-04 DIAGNOSIS — X500XXA Overexertion from strenuous movement or load, initial encounter: Secondary | ICD-10-CM | POA: Insufficient documentation

## 2016-11-04 DIAGNOSIS — S39840A Fracture of corpus cavernosum penis, initial encounter: Secondary | ICD-10-CM | POA: Insufficient documentation

## 2016-11-04 DIAGNOSIS — Y9389 Activity, other specified: Secondary | ICD-10-CM | POA: Insufficient documentation

## 2016-11-04 LAB — CBC
HEMATOCRIT: 38.6 % — AB (ref 39.0–52.0)
Hemoglobin: 13.8 g/dL (ref 13.0–17.0)
MCH: 29.7 pg (ref 26.0–34.0)
MCHC: 35.8 g/dL (ref 30.0–36.0)
MCV: 83 fL (ref 78.0–100.0)
Platelets: 230 10*3/uL (ref 150–400)
RBC: 4.65 MIL/uL (ref 4.22–5.81)
RDW: 12.9 % (ref 11.5–15.5)
WBC: 5.5 10*3/uL (ref 4.0–10.5)

## 2016-11-04 NOTE — ED Triage Notes (Signed)
The pt is c/o pain in his penis tonight.  He had some type injection 2 weeks ago tonight he has had pain when he attempted to have sex with his wife.  swollen

## 2016-11-05 ENCOUNTER — Encounter (HOSPITAL_COMMUNITY)
Admission: EM | Disposition: A | Discharge status: Other Acute Inpatient Hospital | Payer: Self-pay | Source: Home / Self Care | Attending: Emergency Medicine

## 2016-11-05 ENCOUNTER — Emergency Department (HOSPITAL_COMMUNITY): Payer: Self-pay | Admitting: Certified Registered Nurse Anesthetist

## 2016-11-05 ENCOUNTER — Encounter (HOSPITAL_COMMUNITY): Payer: Self-pay | Admitting: Certified Registered Nurse Anesthetist

## 2016-11-05 ENCOUNTER — Emergency Department (HOSPITAL_COMMUNITY)
Admission: EM | Admit: 2016-11-05 | Discharge: 2016-11-05 | Disposition: A | Discharge status: Other Acute Inpatient Hospital | Payer: Self-pay | Attending: Emergency Medicine | Admitting: Emergency Medicine

## 2016-11-05 DIAGNOSIS — S39840A Fracture of corpus cavernosum penis, initial encounter: Secondary | ICD-10-CM

## 2016-11-05 HISTORY — PX: REPAIR OF FRACTURED PENIS: SHX6063

## 2016-11-05 LAB — URINALYSIS, ROUTINE W REFLEX MICROSCOPIC
BACTERIA UA: NONE SEEN
BILIRUBIN URINE: NEGATIVE
GLUCOSE, UA: NEGATIVE mg/dL
KETONES UR: NEGATIVE mg/dL
LEUKOCYTES UA: NEGATIVE
NITRITE: NEGATIVE
PH: 7 (ref 5.0–8.0)
Protein, ur: NEGATIVE mg/dL
Specific Gravity, Urine: 1.017 (ref 1.005–1.030)
Squamous Epithelial / LPF: NONE SEEN

## 2016-11-05 LAB — COMPREHENSIVE METABOLIC PANEL
ALK PHOS: 87 U/L (ref 38–126)
ALT: 22 U/L (ref 17–63)
ANION GAP: 8 (ref 5–15)
AST: 21 U/L (ref 15–41)
Albumin: 4.2 g/dL (ref 3.5–5.0)
BUN: 8 mg/dL (ref 6–20)
CALCIUM: 9.3 mg/dL (ref 8.9–10.3)
CO2: 26 mmol/L (ref 22–32)
Chloride: 106 mmol/L (ref 101–111)
Creatinine, Ser: 0.94 mg/dL (ref 0.61–1.24)
GFR calc non Af Amer: >60 mL/min (ref >60)
Glucose, Bld: 99 mg/dL (ref 65–99)
Potassium: 3.6 mmol/L (ref 3.5–5.1)
SODIUM: 140 mmol/L (ref 135–145)
Total Bilirubin: 0.5 mg/dL (ref 0.3–1.2)
Total Protein: 7.3 g/dL (ref 6.5–8.1)

## 2016-11-05 SURGERY — REPAIR, FRACTURE, PENIS
Anesthesia: General | Site: Penis

## 2016-11-05 MED ORDER — PROPOFOL 10 MG/ML IV BOLUS
INTRAVENOUS | Status: AC
  Filled 2016-11-05: qty 20

## 2016-11-05 MED ORDER — ACETAMINOPHEN 10 MG/ML IV SOLN
1000.0000 mg | Freq: Once | INTRAVENOUS | Status: AC
  Administered 2016-11-05: 1000 mg via INTRAVENOUS

## 2016-11-05 MED ORDER — MIDAZOLAM HCL 5 MG/5ML IJ SOLN
INTRAMUSCULAR | Status: DC | PRN
  Administered 2016-11-05: 2 mg via INTRAVENOUS

## 2016-11-05 MED ORDER — FENTANYL CITRATE (PF) 100 MCG/2ML IJ SOLN
INTRAMUSCULAR | Status: AC
  Filled 2016-11-05: qty 4

## 2016-11-05 MED ORDER — LACTATED RINGERS IV SOLN
INTRAVENOUS | Status: DC | PRN
  Administered 2016-11-05 (×2): via INTRAVENOUS

## 2016-11-05 MED ORDER — OXYCODONE HCL 5 MG/5ML PO SOLN
5.0000 mg | Freq: Once | ORAL | Status: DC | PRN
  Filled 2016-11-05: qty 5

## 2016-11-05 MED ORDER — PROPOFOL 10 MG/ML IV BOLUS
INTRAVENOUS | Status: DC | PRN
  Administered 2016-11-05: 300 mg via INTRAVENOUS

## 2016-11-05 MED ORDER — HYDROMORPHONE HCL 1 MG/ML IJ SOLN
INTRAMUSCULAR | Status: AC
  Filled 2016-11-05: qty 1

## 2016-11-05 MED ORDER — ACETAMINOPHEN 10 MG/ML IV SOLN
INTRAVENOUS | Status: AC
  Filled 2016-11-05: qty 100

## 2016-11-05 MED ORDER — CEFAZOLIN SODIUM-DEXTROSE 2-4 GM/100ML-% IV SOLN
2.0000 g | Freq: Once | INTRAVENOUS | Status: AC
  Administered 2016-11-05: 2 g via INTRAVENOUS
  Filled 2016-11-05: qty 100

## 2016-11-05 MED ORDER — DEXMEDETOMIDINE HCL IN NACL 200 MCG/50ML IV SOLN
INTRAVENOUS | Status: DC | PRN
  Administered 2016-11-05: 20 ug via INTRAVENOUS

## 2016-11-05 MED ORDER — SODIUM CHLORIDE 0.9 % IJ SOLN
INTRAMUSCULAR | Status: DC | PRN
  Administered 2016-11-05: 40 mL

## 2016-11-05 MED ORDER — CEFAZOLIN SODIUM-DEXTROSE 2-4 GM/100ML-% IV SOLN
INTRAVENOUS | Status: AC
  Filled 2016-11-05: qty 100

## 2016-11-05 MED ORDER — ONDANSETRON HCL 4 MG/2ML IJ SOLN: 4.0000 mg | Freq: Four times a day (QID) | INTRAMUSCULAR | Status: DC | PRN

## 2016-11-05 MED ORDER — LIDOCAINE 2% (20 MG/ML) 5 ML SYRINGE
INTRAMUSCULAR | Status: DC | PRN
  Administered 2016-11-05: 100 mg via INTRAVENOUS

## 2016-11-05 MED ORDER — ONDANSETRON HCL 4 MG/2ML IJ SOLN
INTRAMUSCULAR | Status: DC | PRN
  Administered 2016-11-05: 4 mg via INTRAVENOUS

## 2016-11-05 MED ORDER — LACTATED RINGERS IV SOLN: INTRAVENOUS | Status: DC

## 2016-11-05 MED ORDER — LIDOCAINE 2% (20 MG/ML) 5 ML SYRINGE
INTRAMUSCULAR | Status: AC
  Filled 2016-11-05: qty 5

## 2016-11-05 MED ORDER — DEXAMETHASONE SODIUM PHOSPHATE 10 MG/ML IJ SOLN
INTRAMUSCULAR | Status: DC | PRN
  Administered 2016-11-05: 10 mg via INTRAVENOUS

## 2016-11-05 MED ORDER — CEFAZOLIN (ANCEF) 1 G IV SOLR: 2.0000 g | INTRAVENOUS | Status: DC

## 2016-11-05 MED ORDER — HYDROMORPHONE HCL 1 MG/ML IJ SOLN
0.2500 mg | INTRAMUSCULAR | Status: DC | PRN
  Administered 2016-11-05 (×4): 0.5 mg via INTRAVENOUS

## 2016-11-05 MED ORDER — FENTANYL CITRATE (PF) 100 MCG/2ML IJ SOLN
INTRAMUSCULAR | Status: DC | PRN
  Administered 2016-11-05 (×4): 50 ug via INTRAVENOUS

## 2016-11-05 MED ORDER — CEPHALEXIN 500 MG PO CAPS: 500.0000 mg | ORAL_CAPSULE | Freq: Three times a day (TID) | ORAL | 0 refills | Status: DC

## 2016-11-05 MED ORDER — MIDAZOLAM HCL 2 MG/2ML IJ SOLN
INTRAMUSCULAR | Status: AC
  Filled 2016-11-05: qty 2

## 2016-11-05 MED ORDER — ONDANSETRON HCL 4 MG/2ML IJ SOLN
INTRAMUSCULAR | Status: AC
  Filled 2016-11-05: qty 2

## 2016-11-05 MED ORDER — OXYCODONE HCL 5 MG PO TABS: 5.0000 mg | ORAL_TABLET | Freq: Once | ORAL | Status: DC | PRN

## 2016-11-05 MED ORDER — OXYCODONE-ACETAMINOPHEN 5-325 MG PO TABS: 1.0000 | ORAL_TABLET | ORAL | 0 refills | Status: DC | PRN

## 2016-11-05 MED ORDER — BACITRACIN ZINC 500 UNIT/GM EX OINT
TOPICAL_OINTMENT | CUTANEOUS | Status: AC
  Filled 2016-11-05: qty 28.35

## 2016-11-05 MED ORDER — BACITRACIN ZINC 500 UNIT/GM EX OINT
TOPICAL_OINTMENT | CUTANEOUS | Status: DC | PRN
  Administered 2016-11-05: 1 via TOPICAL

## 2016-11-05 MED ORDER — 0.9 % SODIUM CHLORIDE (POUR BTL) OPTIME
TOPICAL | Status: DC | PRN
  Administered 2016-11-05: 1000 mL

## 2016-11-05 MED ORDER — SODIUM CHLORIDE 0.9 % IJ SOLN
INTRAMUSCULAR | Status: AC
  Filled 2016-11-05: qty 150

## 2016-11-05 SURGICAL SUPPLY — 42 items
BAG DECANTER FOR FLEXI CONT (MISCELLANEOUS) ×1 IMPLANT
BLADE SURG 15 STRL LF DISP TIS (BLADE) ×1 IMPLANT
BLADE SURG 15 STRL SS (BLADE) ×2
BNDG COHESIVE 1X5 TAN STRL LF (GAUZE/BANDAGES/DRESSINGS) ×1 IMPLANT
BNDG CONFORM 2 STRL LF (GAUZE/BANDAGES/DRESSINGS) ×2 IMPLANT
CATH FOLEY 2WAY SLVR  5CC 18FR (CATHETERS) ×1
CATH FOLEY 2WAY SLVR 5CC 18FR (CATHETERS) ×1 IMPLANT
COVER SURGICAL LIGHT HANDLE (MISCELLANEOUS) ×2 IMPLANT
DRAIN PENROSE 18X1/2 LTX STRL (DRAIN) ×2 IMPLANT
DRAPE LAPAROTOMY T 98X78 PEDS (DRAPES) ×2 IMPLANT
ELECT REM PT RETURN 9FT ADLT (ELECTROSURGICAL) ×2
ELECTRODE REM PT RTRN 9FT ADLT (ELECTROSURGICAL) ×1 IMPLANT
GAUZE PETROLATUM 1 X8 (GAUZE/BANDAGES/DRESSINGS) ×2 IMPLANT
GAUZE SPONGE 4X4 16PLY XRAY LF (GAUZE/BANDAGES/DRESSINGS) ×3 IMPLANT
GLOVE BIOGEL M STRL SZ7.5 (GLOVE) ×2 IMPLANT
GOWN STRL REUS W/TWL XL LVL3 (GOWN DISPOSABLE) ×2 IMPLANT
IV NS 1000ML (IV SOLUTION) ×2
IV NS 1000ML BAXH (IV SOLUTION) ×1 IMPLANT
KIT BASIN OR (CUSTOM PROCEDURE TRAY) ×2 IMPLANT
NDL INFUSION SET 21GA (NEEDLE) ×1 IMPLANT
NEEDLE HYPO 22GX1.5 SAFETY (NEEDLE) ×2 IMPLANT
NEEDLE INFUSION SET 21GA (NEEDLE) IMPLANT
NS IRRIG 1000ML POUR BTL (IV SOLUTION) ×1 IMPLANT
PACK GENERAL/GYN (CUSTOM PROCEDURE TRAY) ×2 IMPLANT
PLUG CATH AND CAP STER (CATHETERS) IMPLANT
SET CYSTO W/LG BORE CLAMP LF (SET/KITS/TRAYS/PACK) ×1 IMPLANT
SET IRRIG Y TYPE TUR BLADDER L (SET/KITS/TRAYS/PACK) ×2 IMPLANT
SPONGE GAUZE 4X4 12PLY (GAUZE/BANDAGES/DRESSINGS) ×1 IMPLANT
SUT CHROMIC 3 0 SH 27 (SUTURE) ×6 IMPLANT
SUT CHROMIC 4 0 SH 27 (SUTURE) IMPLANT
SUT PDS AB 3-0 SH 27 (SUTURE) ×1 IMPLANT
SUT SILK 2 0 (SUTURE)
SUT SILK 2-0 18XBRD TIE 12 (SUTURE) IMPLANT
SUT VIC AB 2-0 SH 27 (SUTURE) ×4
SUT VIC AB 2-0 SH 27X BRD (SUTURE) IMPLANT
SYR 10ML LL (SYRINGE) ×3 IMPLANT
SYR 50ML LL SCALE MARK (SYRINGE) ×1 IMPLANT
SYR BULB IRRIGATION 50ML (SYRINGE) ×1 IMPLANT
SYR CONTROL 10ML LL (SYRINGE) ×1 IMPLANT
TOWEL OR 17X26 10 PK STRL BLUE (TOWEL DISPOSABLE) ×2 IMPLANT
TOWEL OR NON WOVEN STRL DISP B (DISPOSABLE) ×2 IMPLANT
WATER STERILE IRR 1500ML POUR (IV SOLUTION) ×2 IMPLANT

## 2016-11-05 NOTE — Anesthesia Procedure Notes (Signed)
Procedure Name: LMA Insertion Date/Time: 11/05/2016 12:06 PM Performed by: Maxwell Caul Pre-anesthesia Checklist: Patient identified, Emergency Drugs available, Suction available, Patient being monitored and Timeout performed Patient Re-evaluated:Patient Re-evaluated prior to inductionOxygen Delivery Method: Circle system utilized Preoxygenation: Pre-oxygenation with 100% oxygen Intubation Type: IV induction Ventilation: Mask ventilation without difficulty LMA: LMA inserted LMA Size: 5.0 Number of attempts: 1 Placement Confirmation: positive ETCO2 and breath sounds checked- equal and bilateral Tube secured with: Tape Dental Injury: Teeth and Oropharynx as per pre-operative assessment

## 2016-11-05 NOTE — Op Note (Signed)
Date of procedure: 11/05/16  Preoperative diagnosis:  1. Penile fracture   Postoperative diagnosis:  1. Penile fracture   Procedure: 1. Repair of penile fracture  Surgeon: Baruch Gouty, MD  Anesthesia: General  Complications: None  Intraoperative findings: 2 cm penile fracture of left distal lateral corpora cavernosum that was closed. 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  EBL: 20 cc  Specimens: None  Drains: None  Disposition: Stable to the postanesthesia care unit  Indication for procedure: The patient is a 41 y.o. male with a past medical history of Peyronie's disease who recently completed Xiaflex over 2 weeks ago presented the hospital with a penile fracture..  After reviewing the management options for treatment, the patient elected to proceed with the above surgical procedure(s). We have discussed the potential benefits and risks of the procedure, side effects of the proposed treatment, the likelihood of the patient achieving the goals of the procedure, and any potential problems that might occur during the procedure or recuperation. Informed consent has been obtained.  Description of procedure: The patient was met in the preoperative area. All risks, benefits, and indications of the procedure were described in great detail. The patient consented to the procedure. Preoperative antibiotics were given. The patient was taken to the operative theater. General anesthesia was induced per the anesthesia service. The patient was then placed in the supine position and prepped and draped in the usual sterile fashion. A preoperative timeout was called.   A circumferential incision was made on his circumcision scar. The patient's penis was then degloved to the base of the penis. Foley catheter wasn't placed during this procedure to protect the urethra. After degloving the penis, dartos tissue was removed over the left lateral distal corporal cavernosum where  the penile fracture suspected to be. It was found in this area. His prostatism was incised. It was closed with interrupted 2-0 Vicryl suture. A rubber band was then placed at the base of the penis. Injectable saline was then injected into the corpora cavernosum resulting in strong artificial erection was no leakage from the closed defect. Dartos tissues and reapproximated with a 2-0 Vicryl over the closed corpora cavernosum defect. The circumcision incision was then closed with interrupted 3-0 chromic. This was done after hemostasis was obtained and was excellent. Vaseline gauze was then placed on the incision. It was uncovered and normal gauze. The patient was woken from anesthesia and transferred in stable condition to the postanesthesia care unit.  Plan: The patient will follow-up as currently scheduled with his primary urologist, Dr. Noah Delaine in a couple weeks. He will refrain from intercourse for 6 weeks.  Baruch Gouty, M.D.

## 2016-11-05 NOTE — H&P (Signed)
8:30 AM   Dennis Newton 04-11-1976 [id number]  Referring provider: Dr. Addison Lank  Chief Complaint  Patient presents with  . Groin Swelling    HPI: The patient is a 41 year old gentleman with a past medical history of Peyronie's disease on side effects he presents today with sudden penile pain that occurred during intercourse. The patient was having intercourse with his wife when he felt sudden pain and a popping sensation in his penis. He rapidly loss of erection at that point. The pain was in the distal left third of his corpora. This is the same site that he received Xiaflex at 2 weeks ago. He has never had this problem before. He has not had erections since this occurred approximate 9 hours ago. He has swelling of his penis left greater than right.    PMH: Past Medical History:  Diagnosis Date  . Dysuria   . Internal hemorrhoids   . Peyronie's disease   . Renal cyst, right   . Urethral stricture     Surgical History: Past Surgical History:  Procedure Laterality Date  . COLONOSCOPY WITH ESOPHAGOGASTRODUODENOSCOPY (EGD)  04-07-2015  propofol  . PENILE BIOPSY N/A 07/20/2015   Procedure: PENILE PLICATION;  Surgeon: Cleon Gustin, MD;  Location: [facility];  Service: Urology;  Laterality: N/A;    Home Medications:    Allergies: No Known Allergies  Family History: No family history on file.  Social History:  reports that he has never smoked. He has never used smokeless tobacco. He reports that he does not drink alcohol or use drugs.  ROS: 12 point ROS negative except for above                                        Physical Exam: BP 134/96   Pulse (!) 50   Temp 98.6 F (37 C) (Oral)   Resp 18   Ht 6\' 2"  (1.88 m)   Wt 245 lb (111.1 kg)   SpO2 98%   BMI 31.46 kg/m   Constitutional:  Alert and oriented, No acute distress. HEENT: [place] AT, moist mucus membranes.  Trachea midline, no masses. Cardiovascular: No  clubbing, cyanosis, or edema. Respiratory: Normal respiratory effort, no increased work of breathing. GI: Abdomen is soft, nontender, nondistended, no abdominal masses GU: No CVA tenderness. Swelling of penis noted with greatest swelling in the distal left third of the penis. Tender to palpation of the distal third. Skin: No rashes, bruises or suspicious lesions. Lymph: No cervical or inguinal adenopathy. Neurologic: Grossly intact, no focal deficits, moving all 4 extremities. Psychiatric: Normal mood and affect.  Laboratory Data: Lab Results  Component Value Date   WBC 5.5 11/04/2016   HGB 13.8 11/04/2016   HCT 38.6 (L) 11/04/2016   MCV 83.0 11/04/2016   PLT 230 11/04/2016    Lab Results  Component Value Date   CREATININE 0.94 11/04/2016    No results found for: PSA  No results found for: TESTOSTERONE  No results found for: HGBA1C  Urinalysis    Component Value Date/Time   COLORURINE YELLOW 11/04/2016 [place] 11/04/2016 2320   LABSPEC 1.017 11/04/2016 2320   PHURINE 7.0 11/04/2016 2320   GLUCOSEU NEGATIVE 11/04/2016 2320   HGBUR SMALL (A) 11/04/2016 2320   BILIRUBINUR NEGATIVE 11/04/2016 2320   BILIRUBINUR neg 05/14/2015 [place] 11/04/2016 2320   PROTEINUR NEGATIVE 11/04/2016  2320   UROBILINOGEN 0.2 05/14/2015 1537   NITRITE NEGATIVE 11/04/2016 [place] 11/04/2016 2320      Assessment & Plan:    1. Penile fracture  2. Peyronie's Disease I discussed the patient that given his sudden onset of pain, popping, rapid detumescence of his penis that he likely has a penile fracture. Since further corroborated by his pain being at the site of his recent Xiaflex injection. We discussed that he would need urgent repair of his of this fracture or ureters permanent erectile dysfunction. We discussed that he would have a circumferential incisionpenis that we will repair the defect in his corpora. He understands he will  not be able to have intercourse for a long period of time. He understands this is an emergent operation. We will arrange a stab wound in the very near future.    Nickie Retort, MD

## 2016-11-05 NOTE — ED Provider Notes (Signed)
[facility] DEPT Provider Note   CSN: [id number] Arrival date & time: 11/04/16  2241  History   Chief Complaint Chief Complaint  Patient presents with  . Groin Swelling   HPI Dennis Newton is a 41 y.o. male.  HPI  Patient comes to the ER with acute onset of penile pain. He had an injection to his penis two weeks ago to straighten out angulation. He was told to abstain from sex for 2 weeks, which he was compliant with but while having intercourse he had an immediate onset of pain, did not hear a pop but felt something in his penis with detumescence immediately after. He has been unable to obtain an erection since that incident. He continues have pain and endorses swelling. He had had one previous injection. He reports not swelling or pain before, but now the shaft of his penis is swollen. Unsure of physician name or practice that performed injections.  Past Medical History:  Diagnosis Date  . Dysuria   . Internal hemorrhoids   . Peyronie's disease   . Renal cyst, right   . Urethral stricture     Patient Active Problem List   Diagnosis Date Noted  . BMI 30.0-30.9,adult 09/04/2015  . Pain with ejaculation 02/10/2015  . Rectal bleeding 02/09/2015    Past Surgical History:  Procedure Laterality Date  . COLONOSCOPY WITH ESOPHAGOGASTRODUODENOSCOPY (EGD)  04-07-2015  propofol  . PENILE BIOPSY N/A 07/20/2015   Procedure: PENILE PLICATION;  Surgeon: Cleon Gustin, MD;  Location: [facility];  Service: Urology;  Laterality: N/A;       Home Medications    Prior to Admission medications   Not on File    Family History No family history on file.  Social History Social History  Substance Use Topics  . Smoking status: Never Smoker  . Smokeless tobacco: Never Used  . Alcohol use No     Allergies   Patient has no known allergies.   Review of Systems Review of Systems  Review of Systems All other systems negative except as documented in the  HPI. All pertinent positives and negatives as reviewed in the HPI.  Physical Exam Updated Vital Signs BP 132/99   Pulse (!) 57   Temp 98.6 F (37 C) (Oral)   Resp 18   Ht 6\' 2"  (1.88 m)   Wt 111.1 kg   SpO2 99%   BMI 31.46 kg/m   Physical Exam  Constitutional: He appears well-developed and well-nourished. No distress.  HENT:  Head: Normocephalic and atraumatic.  Eyes: Pupils are equal, round, and reactive to light.  Neck: Normal range of motion. Neck supple.  Cardiovascular: Normal rate and regular rhythm.   Pulmonary/Chest: Effort normal.  Abdominal: Soft.  Genitourinary:  Genitourinary Comments: Significant swelling to left side of penile shaft, no increased pain with palpation. Penis is flaccid. No warmth or hematoma appreciated. No testicular swelling or pain. RN present as a Producer, television/film/video.   Neurological: He is alert.  Skin: Skin is warm and dry.  Nursing note and vitals reviewed.   ED Treatments / Results  Labs (all labs ordered are listed, but only abnormal results are displayed) Labs Reviewed  CBC - Abnormal; Notable for the following:       Result Value   HCT 38.6 (*)    All other components within normal limits  URINALYSIS, ROUTINE W REFLEX MICROSCOPIC - Abnormal; Notable for the following:    Hgb urine dipstick SMALL (*)    All other components  within normal limits  COMPREHENSIVE METABOLIC PANEL    EKG  EKG Interpretation None       Radiology No results found.  Procedures Procedures (including critical care time)  Medications Ordered in ED Medications  ceFAZolin (ANCEF) IVPB 2g/100 mL premix (2 g Intravenous Not Given 11/05/16 0847)     Initial Impression / Assessment and Plan / ED Course  I have reviewed the triage vital signs and the nursing notes.  Pertinent labs & imaging results that were available during my care of the patient were reviewed by me and considered in my medical decision making (see chart for details).  Clinical Course      I discussed case with Urology Dr. Pilar Jarvis, he is concerned for penile fracture and is going to come see the patient at [facility] for further examination and HPI.   Dr. Pilar Jarvis has seen patient and is concerned for penile fracture, will transfer to [facility]. Dr. Leonette Monarch, P and patient aware of plan.  Final Clinical Impressions(s) / ED Diagnoses   Final diagnoses:  Fracture of corpus cavernosum penis, initial encounter    New Prescriptions New Prescriptions   No medications on file     Delos Haring, Hershal Coria 11/05/16 [place], MD 11/05/16 [phone]

## 2016-11-05 NOTE — ED Notes (Signed)
Patient provided transfer and discharge paperwork and instructed to go to [facility] ED at this time. Pt verbalized understanding of instructions.

## 2016-11-05 NOTE — ED Notes (Signed)
Dr Leonette Monarch advised patient will be discharged from this ED and sent to [facility] via [facility] for urology procedure. MD advised not to administer antibiotic at this time

## 2016-11-05 NOTE — ED Notes (Signed)
Surgical RN at [facility] long advised this RN that patient may be sent over to them at this time.

## 2016-11-05 NOTE — ED Notes (Signed)
ED Provider at bedside. 

## 2016-11-05 NOTE — ED Notes (Signed)
Seth Bake, RN at [facility] long advised this RN that patient will go to [facility] ED to check in when he arrives. PA Green made aware

## 2016-11-05 NOTE — ED Notes (Signed)
Gennette Pac RN at [facility] made aware that patient is on the way to their ED at this time

## 2016-11-05 NOTE — Anesthesia Postprocedure Evaluation (Signed)
Anesthesia Post Note  Patient: Dennis Newton  Procedure(s) Performed: Procedure(s) (LRB): REPAIR OF FRACTURED PENIS (N/A)  Patient location during evaluation: PACU Anesthesia Type: General Level of consciousness: awake and alert and patient cooperative Pain management: pain level controlled Vital Signs Assessment: post-procedure vital signs reviewed and stable Respiratory status: spontaneous breathing and respiratory function stable Cardiovascular status: stable Anesthetic complications: no       Last Vitals:  Vitals:   11/05/16 1500 11/05/16 1518  BP: (!) 142/91 (!) 156/88  Pulse: 66 70  Resp: 16 16  Temp:  36.6 C    Last Pain:  Vitals:   11/05/16 1518  TempSrc:   PainSc: [place]

## 2016-11-05 NOTE — ED Notes (Signed)
Pt states he has a 30 degree bend in his penis and he has been going for injections to straighten it out. Pt states he had an injection 2 weeks go and was told no intercourse for 2 weeks. Pt attempted to have intercourse with his wife this evening and same was very painful. Pt states he waited 2 weeks as he was told but there is still pain.

## 2016-11-05 NOTE — Progress Notes (Signed)
PACU Nursing Note: Pt alert and oriented x 4, ambulates without difficulty, gait very steady, pain undercontrol, denies nausea, tolerating PO fluids well, able to void w/o difficulty, pain, no bleeding noted. DC instructions reviewed with pt along with two (2) Rx written by primary MD, discussed importance of taking all abx as prescribed, discussed safety while taking PO narcotics for pain. Also provided instructions in when to call MD, I.e. Pain unrelieved, unable to void, s/s of infection. Stressed importance of making and keeping follow up MD apt. Discussed importance of handwashing as well. Anesthesia post op instructions also reviewed with pt and wife. Opportunity for questions provided. Teach-Back method used

## 2016-11-05 NOTE — ED Triage Notes (Signed)
Pt transported self from [facility]. Pt is to be treated by urologist in the [place]. Pt c/o penile trauma during intercourse last night. Pt was able to urinate this am.Denies bleeding. Reports pain on urination. Wife at bedside

## 2016-11-05 NOTE — Anesthesia Preprocedure Evaluation (Signed)
Anesthesia Evaluation  Patient identified by MRN, date of birth, ID band Patient awake    Reviewed: Allergy & Precautions, H&P , NPO status , Patient's Chart, lab work & pertinent test results  Airway Mallampati: II   Neck ROM: full    Dental   Pulmonary neg pulmonary ROS,    breath sounds clear to auscultation       Cardiovascular negative cardio ROS   Rhythm:regular Rate:Normal     Neuro/Psych    GI/Hepatic   Endo/Other    Renal/GU    Peyronie's dz.  Penile fracture    Musculoskeletal   Abdominal   Peds  Hematology   Anesthesia Other Findings   Reproductive/Obstetrics                             Anesthesia Physical Anesthesia Plan  ASA: II and emergent  Anesthesia Plan: General   Post-op Pain Management:    Induction: Intravenous  Airway Management Planned: Oral ETT  Additional Equipment:   Intra-op Plan:   Post-operative Plan: Extubation in OR  Informed Consent: I have reviewed the patients History and Physical, chart, labs and discussed the procedure including the risks, benefits and alternatives for the proposed anesthesia with the patient or authorized representative who has indicated his/her understanding and acceptance.     Plan Discussed with: CRNA, Anesthesiologist and Surgeon  Anesthesia Plan Comments:         Anesthesia Quick Evaluation

## 2016-11-05 NOTE — ED Provider Notes (Signed)
Patient is an ER to ER transfer from [facility] to [facility] Emergency Department. He has been allowed to go by private car. He is being transferred to [facility] ED to see Urology- Dr. Pilar Jarvis who will likely take patient to surgery to address his penile fracture.   Delos Haring, PA-C 11/05/16 1012

## 2016-11-05 NOTE — Transfer of Care (Signed)
Immediate Anesthesia Transfer of Care Note  Patient: Dennis Newton  Procedure(s) Performed: Procedure(s): REPAIR OF FRACTURED PENIS (N/A)  Patient Location: PACU  Anesthesia Type:General  Level of Consciousness: awake, alert  and oriented  Airway & Oxygen Therapy: Patient Spontanous Breathing and Patient connected to face mask oxygen  Post-op Assessment: Report given to RN and Post -op Vital signs reviewed and stable  Post vital signs: Reviewed and stable  Last Vitals:  Vitals:   11/05/16 1013 11/05/16 1057  BP: 140/89 129/92  Pulse: 60 (!) 57  Resp: 15 18  Temp: 36.9 C 36.6 C    Last Pain:  Vitals:   11/05/16 1058  TempSrc:   PainSc: 9          Complications: No apparent anesthesia complications

## 2016-11-05 NOTE — ED Triage Notes (Signed)
Urologist at beside. OR permit completed by urologist. Pt changed to into hospital gown. Belongings given to wife at bedside. OR notified that pt is enroute.

## 2016-11-07 ENCOUNTER — Encounter (HOSPITAL_COMMUNITY): Payer: Self-pay | Admitting: Urology

## 2016-11-18 IMAGING — US US ABDOMEN COMPLETE W/ ELASTOGRAPHY
1 series · 13 of 25 positions shown · non-contrast
Comparison: None.

CLINICAL DATA: Lobular liver, evaluate for cirrhosis



[Series 1: us abdomen complete w/ elastography · 0.15mm/px · 13 of 71 slices shown]
[im 1/71]
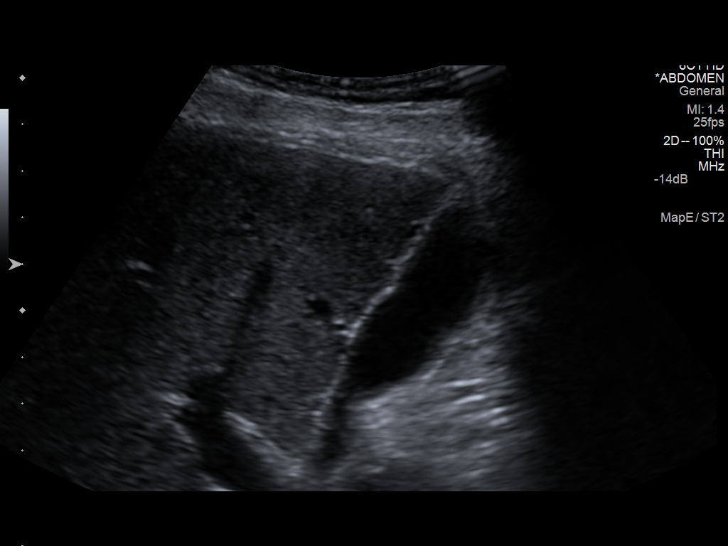
[im 6/71]
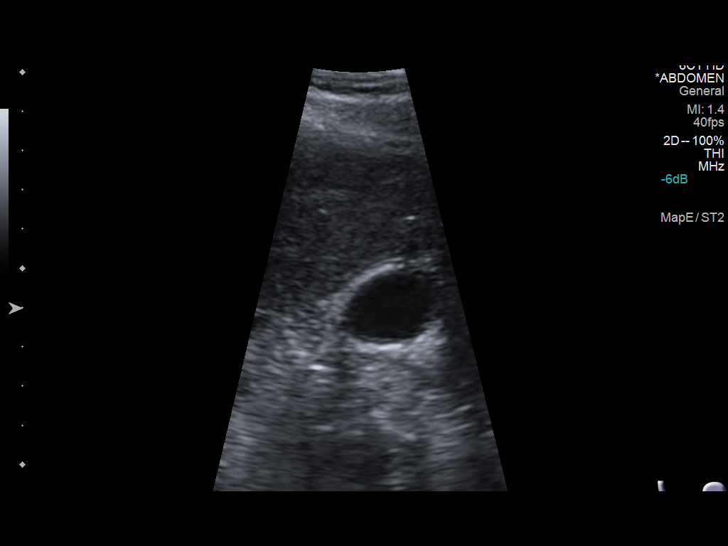
[im 12/71]
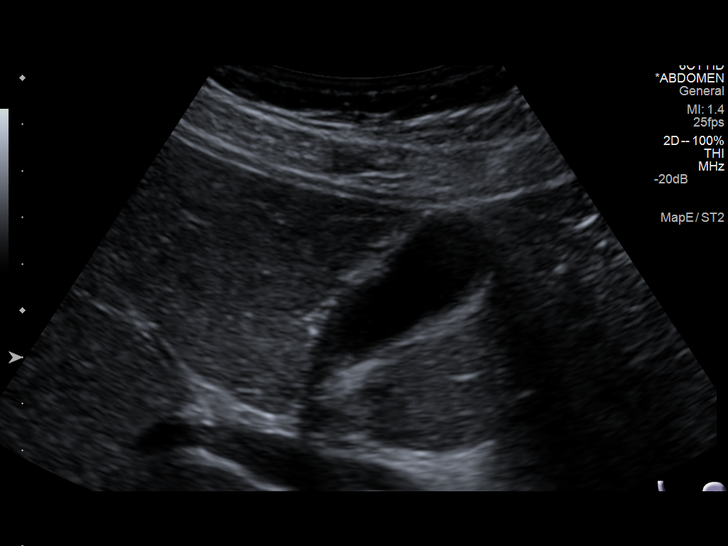
[im 18/71]
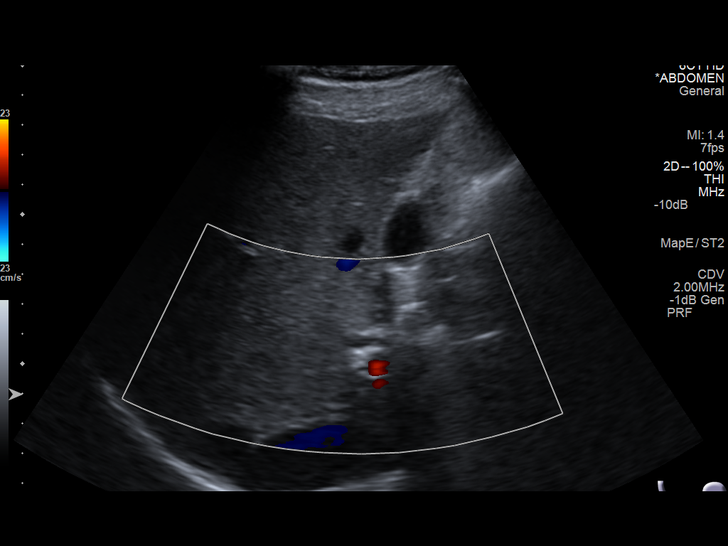
[im 24/71]
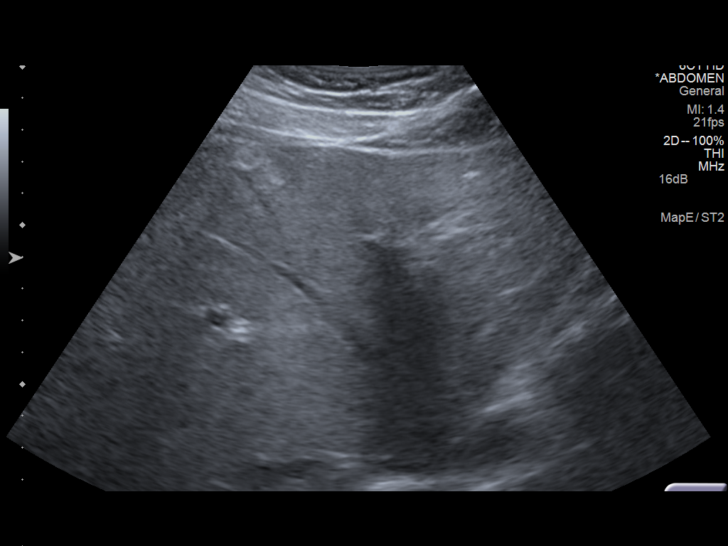
[im 30/71]
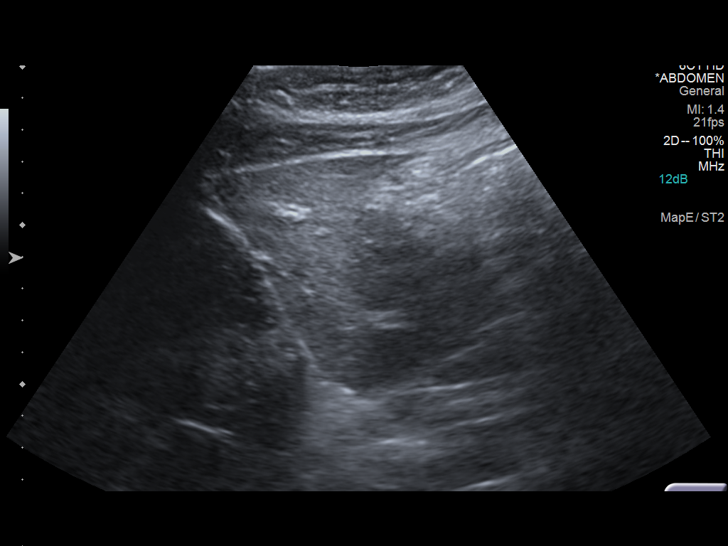
[im 36/71]
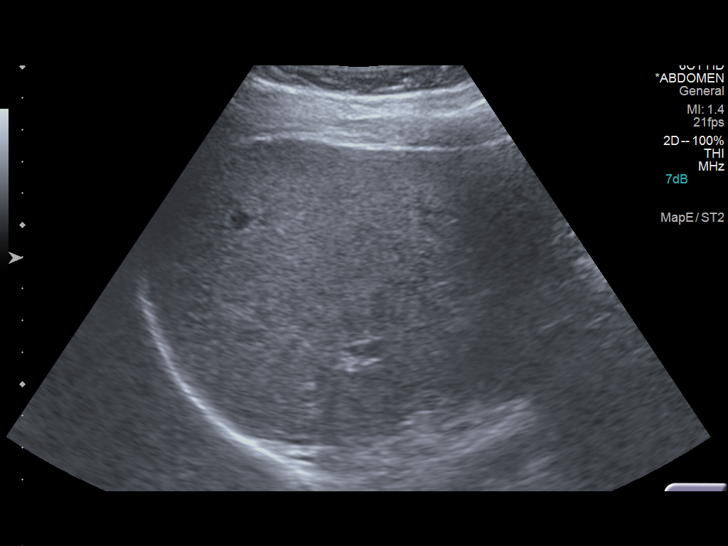
[im 41/71]
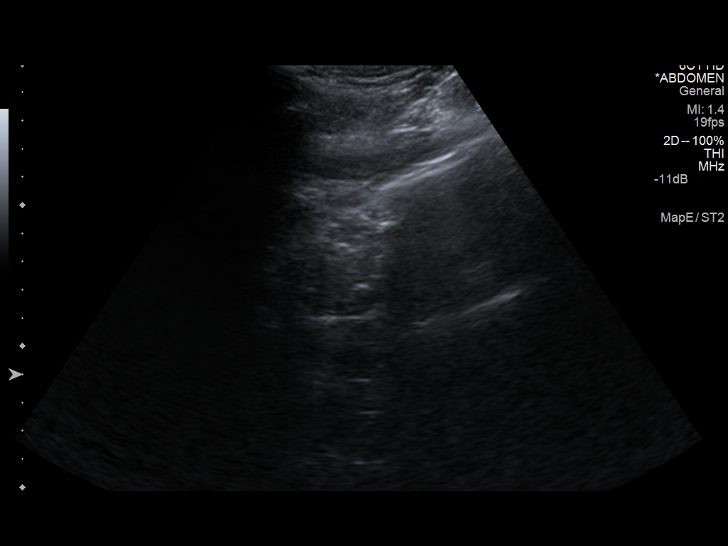
[im 47/71]
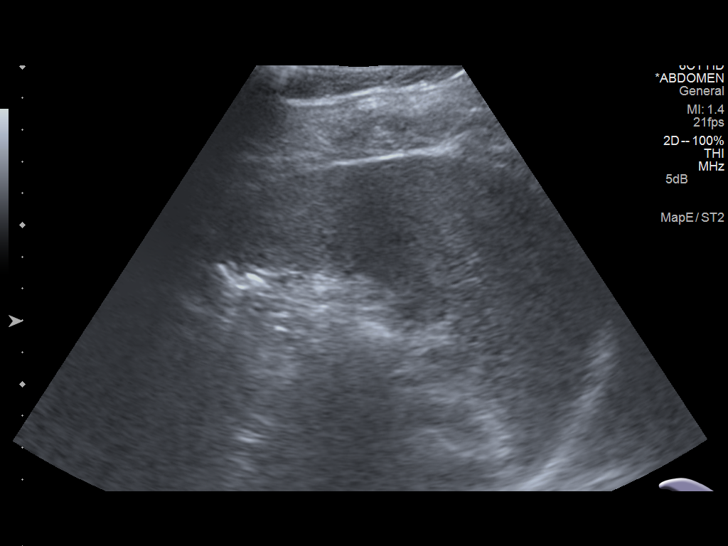
[im 53/71]
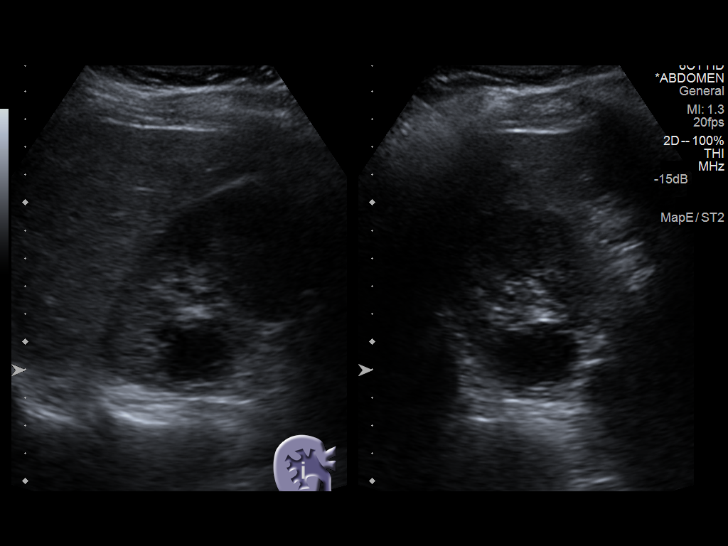
[im 59/71]
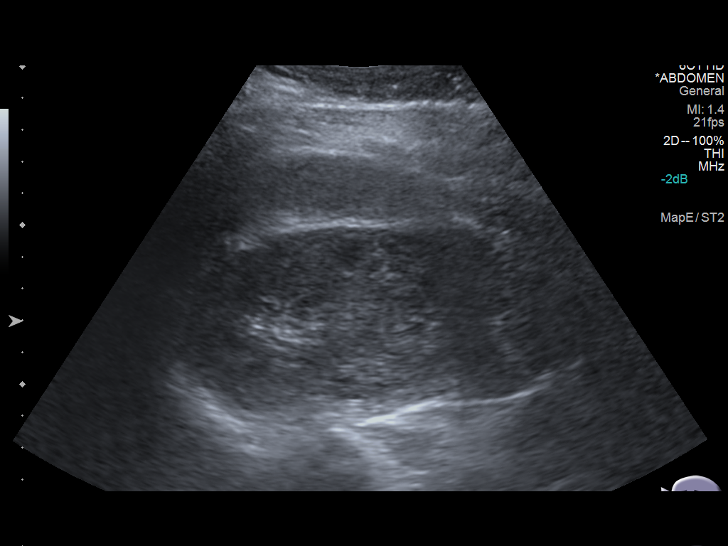
[im 65/71]
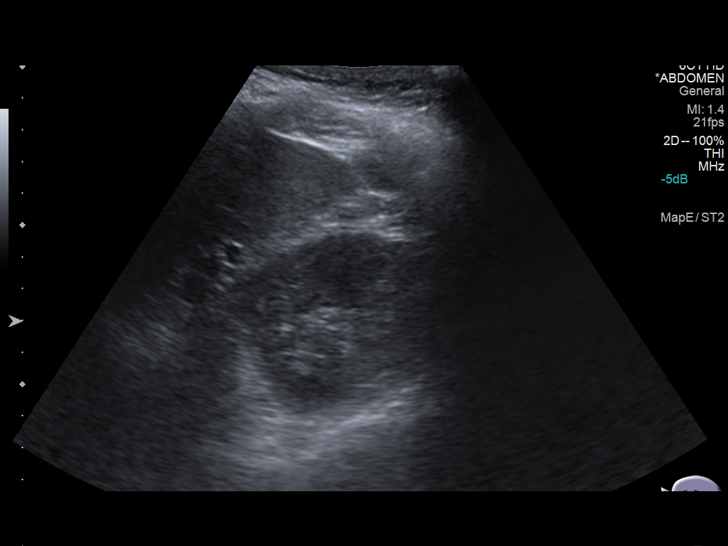
[im 71/71]
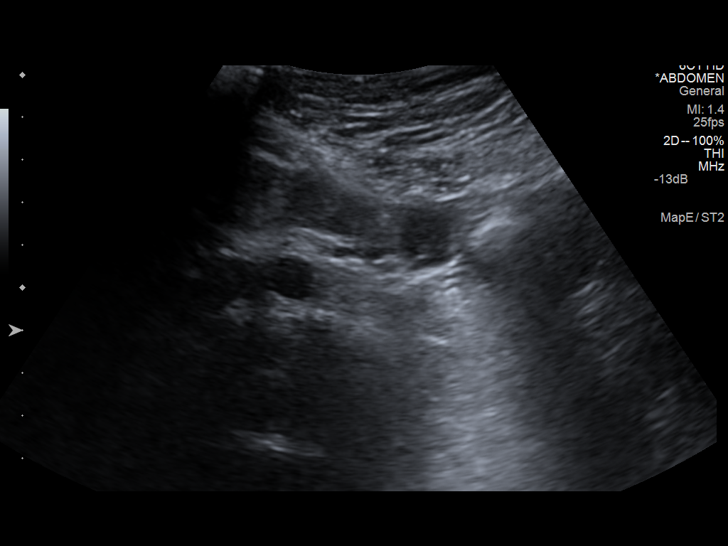

[13 of 25 positions shown; findings below may reference images not displayed]

FINDINGS: ULTRASOUND ABDOMEN

Gallbladder: No gallstones, gallbladder wall thickening, or
pericholecystic fluid. Negative sonographic Murphy's sign.

Common bile duct: Diameter: 4 mm

Liver: Coarse hepatic echotexture. At the upper limits of normal for
parenchymal echogenicity. No focal hepatic lesion is seen.

IVC: No abnormality visualized.

Pancreas: Poorly visualized due to overlying bowel gas.

Spleen: Size and appearance within normal limits.

Right Kidney: Length: 12.0 cm. 2.8 x 2.1 x 3.2 cm upper pole renal
cyst.

Left Kidney: Length: 12.4 cm. No mass or hydronephrosis.

Abdominal aorta: No aneurysm visualized.

Other findings: None.

ULTRASOUND HEPATIC ELASTOGRAPHY

Device: Siemens Helix VTQ

Transducer 6C1

Patient position: Left lateral decubitus

Number of measurements:  10

Hepatic Segment:  8

Median velocity:   2.59  m/sec

IQR:

IQR/Median velocity ratio

Corresponding Metavir fibrosis score:  F3/F4

Risk of fibrosis: High

Limitations of exam: None

Pertinent findings noted on other imaging exams:  None

Please note that abnormal shear wave velocities may also be
identified in clinical settings other than with hepatic fibrosis,
such as: acute hepatitis, elevated right heart and central venous
pressures including use of beta blockers, Ayrelia disease
([place]), infiltrative processes such as
mastocytosis/amyloidosis/infiltrative tumor, extrahepatic
cholestasis, in the post-prandial state, and liver transplantation.
Correlation with patient history, laboratory data, and clinical
condition recommended.
IMPRESSION: No focal hepatic lesion is seen.

2.8 cm right upper pole renal cyst.

Median hepatic shear wave velocity is calculated at 2.59 m/sec.

Corresponding Metavir fibrosis score is F3/F4.

Risk of fibrosis is high.

Follow-up:  Follow-up advised.

## 2016-12-08 ENCOUNTER — Ambulatory Visit (INDEPENDENT_AMBULATORY_CARE_PROVIDER_SITE_OTHER): Payer: BLUE CROSS/BLUE SHIELD | Admitting: Physician Assistant

## 2016-12-08 VITALS — BP 112/76 | HR 61 | Temp 97.9°F | Ht 75.0 in | Wt 255.2 lb

## 2016-12-08 DIAGNOSIS — R42 Dizziness and giddiness: Secondary | ICD-10-CM | POA: Diagnosis not present

## 2016-12-08 DIAGNOSIS — R070 Pain in throat: Secondary | ICD-10-CM

## 2016-12-08 DIAGNOSIS — J029 Acute pharyngitis, unspecified: Secondary | ICD-10-CM | POA: Diagnosis not present

## 2016-12-08 LAB — POCT RAPID STREP A (OFFICE): Rapid Strep A Screen: NEGATIVE

## 2016-12-08 MED ORDER — AMOXICILLIN 500 MG PO CAPS: 500.0000 mg | ORAL_CAPSULE | Freq: Three times a day (TID) | ORAL | 0 refills | Status: AC

## 2016-12-08 NOTE — Progress Notes (Signed)
[facility] [address] [place] [address] [phone]  Date:  12/08/2016   Name:  Dennis Newton   DOB:  August 20, 1976   MRN:  [id number]  PCP:  Lamar Blinks, MD    History of Present Illness:  Chief Complaint  Patient presents with  . Sore Throat    X 2-3 day-  pt states that he feel "off balance" X 1 mth    Dennis Newton is a 41 y.o. male patient who presents to [facility] for cc of sore throat.  Pain with swallowing food. Started 2-3 days ago.  No nasal congestion.  No coughing.  Pain with eating .  No sob or dyspnea.  No fever.  He states there is no hx of reflux.    He feels like he is losing balance.  If he gets up too fast.  2-3 weeks ago, he noticed that this .  No hx of anemia, ear pain, hearing loss, blood or black stool (hx of rectal bleeding due to internal hemorrhoids),  Diet consist lamb or beef, eating vegetables not daily.   Water intake, 64 oz of water per day.   He had surgery one month ago,   Results for orders placed or performed in visit on 12/08/16  POCT rapid strep A  Result Value Ref Range   Rapid Strep A Screen Negative Negative   Orthostatic VS for the past 24 hrs (Last 3 readings):  BP- Lying Pulse- Lying BP- Sitting Pulse- Sitting BP- Standing at 0 minutes Pulse- Standing at 0 minutes  12/08/16 0916 126/83 55 126/88 56 125/86 54    Patient Active Problem List   Diagnosis Date Noted  . BMI 30.0-30.9,adult 09/04/2015  . Pain with ejaculation 02/10/2015  . Rectal bleeding 02/09/2015    Past Medical History:  Diagnosis Date  . Dysuria   . Internal hemorrhoids   . Peyronie's disease   . Renal cyst, right   . Urethral stricture     Past Surgical History:  Procedure Laterality Date  . COLONOSCOPY WITH ESOPHAGOGASTRODUODENOSCOPY (EGD)  04-07-2015  propofol  . PENILE BIOPSY N/A 07/20/2015   Procedure: PENILE PLICATION;  Surgeon: Cleon Gustin, MD;  Location: [facility];  Service: Urology;   Laterality: N/A;  . REPAIR OF FRACTURED PENIS N/A 11/05/2016   Procedure: REPAIR OF FRACTURED PENIS;  Surgeon: Nickie Retort, MD;  Location: [facility];  Service: Urology;  Laterality: N/A;    Social History  Substance Use Topics  . Smoking status: Never Smoker  . Smokeless tobacco: Never Used  . Alcohol use No    Family History  Problem Relation Age of Onset  . Stroke Mother     No Known Allergies  Medication list has been reviewed and updated.  Current Outpatient Prescriptions on File Prior to Visit  Medication Sig Dispense Refill  . cephALEXin (KEFLEX) 500 MG capsule Take 1 capsule (500 mg total) by mouth 3 (three) times daily. (Patient not taking: Reported on 12/08/2016) 9 capsule 0  . oxyCODONE-acetaminophen (ROXICET) 5-325 MG tablet Take 1-2 tablets by mouth every 4 (four) hours as needed for severe pain. (Patient not taking: Reported on 12/08/2016) 30 tablet 0   No current facility-administered medications on file prior to visit.     ROS ROS otherwise unremarkable unless listed above.   Physical Examination: BP 112/76   Pulse 61   Temp 97.9 F (36.6 C) (Oral)   Ht 6\' 3"  (1.905 m)   Wt 255 lb  3.2 oz (115.8 kg)   SpO2 100%   BMI 31.90 kg/m  Ideal Body Weight: Weight in (lb) to have BMI = 25: 199.6  Physical Exam  Constitutional: He is oriented to person, place, and time. He appears well-developed and well-nourished. No distress.  HENT:  Head: Normocephalic and atraumatic.  Mouth/Throat: Oropharyngeal exudate (very mild), posterior oropharyngeal edema and posterior oropharyngeal erythema present.  Eyes: Conjunctivae and EOM are normal. Pupils are equal, round, and reactive to light.  Cardiovascular: Normal rate.   Pulmonary/Chest: Effort normal. No respiratory distress.  Neurological: He is alert and oriented to person, place, and time.  Skin: Skin is warm and dry. He is not diaphoretic.  Psychiatric: He has a normal mood and affect. His behavior is normal.    Results for orders placed or performed in visit on 12/08/16  Culture, Group A Strep  Result Value Ref Range   Strep A Culture Comment (A)   CBC with Differential/Platelet  Result Value Ref Range   WBC 6.4 3.4 - 10.8 x10E3/uL   RBC 4.54 4.14 - 5.80 x10E6/uL   Hemoglobin 13.8 13.0 - 17.7 g/dL   Hematocrit 39.5 37.5 - 51.0 %   MCV 87 79 - 97 fL   MCH 30.4 26.6 - 33.0 pg   MCHC 34.9 31.5 - 35.7 g/dL   RDW 13.9 12.3 - 15.4 %   Platelets 215 150 - 379 x10E3/uL   Neutrophils 45 Not Estab. %   Lymphs 38 Not Estab. %   Monocytes 11 Not Estab. %   Eos 6 Not Estab. %   Basos 0 Not Estab. %   Neutrophils Absolute 2.9 1.4 - 7.0 x10E3/uL   Lymphocytes Absolute 2.4 0.7 - 3.1 x10E3/uL   Monocytes Absolute 0.7 0.1 - 0.9 x10E3/uL   EOS (ABSOLUTE) 0.4 0.0 - 0.4 x10E3/uL   Basophils Absolute 0.0 0.0 - 0.2 x10E3/uL   Immature Granulocytes 0 Not Estab. %   Immature Grans (Abs) 0.0 0.0 - 0.1 x10E3/uL  Vitamin B12  Result Value Ref Range   Vitamin B-12 547 232 - 1,245 pg/mL  POCT rapid strep A  Result Value Ref Range   Rapid Strep A Screen Negative Negative     Assessment and Plan: Dennis Newton is a 41 y.o. male who is here today for cc of sore throat I will treat empirically with amoxicillin today.  Will discontinue abx if negative.   Dizziness - Plan: Orthostatic vital signs, CBC with Differential/Platelet, Vitamin B12  Throat pain - Plan: POCT rapid strep A, Culture, Group A Strep  Ivar Drape, PA-C [facility] 2/27/20188:35 AM

## 2016-12-08 NOTE — Patient Instructions (Addendum)
I will have your lab results within the next 2 weeks.   I would like you to make sure you continue to hydrate well with water 64 oz.  And eat fresh vegetables, not cooked.   I will contact you with the strep culture, but we treat it with amoxicillin in the meantime.      IF you received an x-ray today, you will receive an invoice from [facility]. Please contact [facility] at [phone] with questions or concerns regarding your invoice.   IF you received labwork today, you will receive an invoice from [facility]. Please contact [facility] at [phone] with questions or concerns regarding your invoice.   Our billing staff will not be able to assist you with questions regarding bills from these companies.  You will be contacted with the lab results as soon as they are available. The fastest way to get your results is to activate your My Chart account. Instructions are located on the last page of this paperwork. If you have not heard from [place] regarding the results in 2 weeks, please contact this office.

## 2016-12-09 LAB — CBC WITH DIFFERENTIAL/PLATELET
Basophils Absolute: 0 10*3/uL (ref 0.0–0.2)
Basos: 0 %
EOS (ABSOLUTE): 0.4 10*3/uL (ref 0.0–0.4)
EOS: 6 %
HEMATOCRIT: 39.5 % (ref 37.5–51.0)
HEMOGLOBIN: 13.8 g/dL (ref 13.0–17.7)
IMMATURE GRANS (ABS): 0 10*3/uL (ref 0.0–0.1)
Immature Granulocytes: 0 %
LYMPHS ABS: 2.4 10*3/uL (ref 0.7–3.1)
LYMPHS: 38 %
MCH: 30.4 pg (ref 26.6–33.0)
MCHC: 34.9 g/dL (ref 31.5–35.7)
MCV: 87 fL (ref 79–97)
MONOCYTES: 11 %
Monocytes Absolute: 0.7 10*3/uL (ref 0.1–0.9)
Neutrophils Absolute: 2.9 10*3/uL (ref 1.4–7.0)
Neutrophils: 45 %
Platelets: 215 10*3/uL (ref 150–379)
RBC: 4.54 x10E6/uL (ref 4.14–5.80)
RDW: 13.9 % (ref 12.3–15.4)
WBC: 6.4 10*3/uL (ref 3.4–10.8)

## 2016-12-09 LAB — VITAMIN B12: Vitamin B-12: 547 pg/mL (ref 232–1245)

## 2016-12-10 LAB — CULTURE, GROUP A STREP

## 2016-12-22 ENCOUNTER — Ambulatory Visit (INDEPENDENT_AMBULATORY_CARE_PROVIDER_SITE_OTHER): Payer: Self-pay | Admitting: Physician Assistant

## 2016-12-22 VITALS — BP 108/78 | HR 65 | Temp 98.6°F | Resp 16 | Ht 74.0 in | Wt 250.0 lb

## 2016-12-22 DIAGNOSIS — Z0289 Encounter for other administrative examinations: Secondary | ICD-10-CM

## 2016-12-22 NOTE — Progress Notes (Signed)
G[employer] Medical Examination   Dennis Newton is a 41 y.o. male who presents today for a commercial driver fitness determination physical exam. The patient reports no problems today. In the past the patient reports receiving 2 year certificates. He denies focal neurological deficits, vision and hearing changes. He denies the habitual use of benzodiazepines and opioids.  Past Medical History:  Diagnosis Date  . Dysuria   . Internal hemorrhoids   . Peyronie's disease   . Renal cyst, right   . Urethral stricture     Current medications, family history, allergies, social history reviewed by me and exist elsewhere in the encounter.   Review of Systems  Constitutional: Negative for chills and fever.  Eyes: Negative for blurred vision.  Respiratory: Negative for cough and shortness of breath.   Cardiovascular: Negative for chest pain and palpitations.  Gastrointestinal: Negative for abdominal pain, nausea and vomiting.  Genitourinary: Negative for dysuria, frequency and urgency.  Musculoskeletal: Negative for back pain and myalgias.  Skin: Negative for rash.  Neurological: Negative for dizziness, tingling, tremors, sensory change, speech change, focal weakness, seizures, loss of consciousness and headaches.  Psychiatric/Behavioral: The patient does not have insomnia.     Objective:     Vision/hearing:  Visual Acuity Screening   Right eye Left eye Both eyes  Without correction: 20/13 -1 20/13 -1 20/13 -2  With correction:     Comments: Colors 6/6 Titmus 85% Bilateral  Hearing Screening Comments: Whisper 9 ft  Applicant can recognize and distinguish among traffic control signals and devices showing standard red, green, and amber colors.  Corrective lenses required: No  Monocular Vision?: No  Hearing aid requirement: No  Physical Exam  Constitutional: He is oriented to person, place, and time. He appears well-developed. He does not appear ill.  Eyes: Conjunctivae  and EOM are normal. Pupils are equal, round, and reactive to light.  Cardiovascular: Normal rate, regular rhythm and normal heart sounds.  Exam reveals no gallop, no friction rub and no decreased pulses.   No murmur heard. Pulmonary/Chest: Effort normal and breath sounds normal. No respiratory distress. He has no decreased breath sounds. He has no wheezes. He has no rhonchi. He has no rales.  Abdominal: He exhibits no distension.  Musculoskeletal: Normal range of motion. He exhibits no edema.  Neurological: He is alert and oriented to person, place, and time. No cranial nerve deficit. Coordination normal.  Skin: Skin is warm and dry. He is not diaphoretic.  Psychiatric: He has a normal mood and affect.  Nursing note and vitals reviewed.   BP 108/78   Pulse 65   Temp 98.6 F (37 C) (Oral)   Resp 16   Ht 6\' 2"  (1.88 m)   Wt 250 lb (113.4 kg)   SpO2 100%   BMI 32.10 kg/m   Labs: Comments: Urinalysis: Blood - NEG, Glucose - NEG, Protein - NEG, SG - 1.020  Assessment:    Healthy male exam.  Meets standards in 50 CFR 391.41;  qualifies for 2 year certificate.    Plan:    Medical examiners certificate completed and printed. Return as needed.

## 2016-12-22 NOTE — Patient Instructions (Signed)
     IF you received an x-ray today, you will receive an invoice from [facility]. Please contact [facility] at [phone] with questions or concerns regarding your invoice.   IF you received labwork today, you will receive an invoice from [facility]. Please contact [facility] at [phone] with questions or concerns regarding your invoice.   Our billing staff will not be able to assist you with questions regarding bills from these companies.  You will be contacted with the lab results as soon as they are available. The fastest way to get your results is to activate your My Chart account. Instructions are located on the last page of this paperwork. If you have not heard from us regarding the results in 2 weeks, please contact this office.     

## 2017-11-17 ENCOUNTER — Other Ambulatory Visit: Payer: Self-pay

## 2017-11-17 ENCOUNTER — Encounter: Payer: Self-pay | Admitting: Emergency Medicine

## 2017-11-17 ENCOUNTER — Ambulatory Visit (INDEPENDENT_AMBULATORY_CARE_PROVIDER_SITE_OTHER): Payer: BLUE CROSS/BLUE SHIELD | Admitting: Emergency Medicine

## 2017-11-17 VITALS — BP 101/67 | HR 66 | Temp 98.0°F | Resp 16 | Ht 73.0 in | Wt 236.8 lb

## 2017-11-17 DIAGNOSIS — Z0000 Encounter for general adult medical examination without abnormal findings: Secondary | ICD-10-CM | POA: Diagnosis not present

## 2017-11-17 NOTE — Patient Instructions (Addendum)
   IF you received an x-ray today, you will receive an invoice from [facility]. Please contact  Radiology at [phone] with questions or concerns regarding your invoice.   IF you received labwork today, you will receive an invoice from [facility]. Please contact [facility] at [phone] with questions or concerns regarding your invoice.   Our billing staff will not be able to assist you with questions regarding bills from these companies.  You will be contacted with the lab results as soon as they are available. The fastest way to get your results is to activate your My Chart account. Instructions are located on the last page of this paperwork. If you have not heard from us regarding the results in 2 weeks, please contact this office.      Health Maintenance, Male A healthy lifestyle and preventive care is important for your health and wellness. Ask your health care provider about what schedule of regular examinations is right for you. What should I know about weight and diet? Eat a Healthy Diet  Eat plenty of vegetables, fruits, whole grains, low-fat dairy products, and lean protein.  Do not eat a lot of foods high in solid fats, added sugars, or salt.  Maintain a Healthy Weight Regular exercise can help you achieve or maintain a healthy weight. You should:  Do at least 150 minutes of exercise each week. The exercise should increase your heart rate and make you sweat (moderate-intensity exercise).  Do strength-training exercises at least twice a week.  Watch Your Levels of Cholesterol and Blood Lipids  Have your blood tested for lipids and cholesterol every 5 years starting at 44 years of age. If you are at high risk for heart disease, you should start having your blood tested when you are 44 years old. You may need to have your cholesterol levels checked more often if: ? Your lipid or cholesterol levels are high. ? You are older than 44 years of age. ? You  are at high risk for heart disease.  What should I know about cancer screening? Many types of cancers can be detected early and may often be prevented. Lung Cancer  You should be screened every year for lung cancer if: ? You are a current smoker who has smoked for at least 30 years. ? You are a former smoker who has quit within the past 15 years.  Talk to your health care provider about your screening options, when you should start screening, and how often you should be screened.  Colorectal Cancer  Routine colorectal cancer screening usually begins at 44 years of age and should be repeated every 5-10 years until you are 44 years old. You may need to be screened more often if early forms of precancerous polyps or small growths are found. Your health care provider may recommend screening at an earlier age if you have risk factors for colon cancer.  Your health care provider may recommend using home test kits to check for hidden blood in the stool.  A small camera at the end of a tube can be used to examine your colon (sigmoidoscopy or colonoscopy). This checks for the earliest forms of colorectal cancer.  Prostate and Testicular Cancer  Depending on your age and overall health, your health care provider may do certain tests to screen for prostate and testicular cancer.  Talk to your health care provider about any symptoms or concerns you have about testicular or prostate cancer.  Skin Cancer  Check your skin   from head to toe regularly.  Tell your health care provider about any new moles or changes in moles, especially if: ? There is a change in a mole's size, shape, or color. ? You have a mole that is larger than a pencil eraser.  Always use sunscreen. Apply sunscreen liberally and repeat throughout the day.  Protect yourself by wearing long sleeves, pants, a wide-brimmed hat, and sunglasses when outside.  What should I know about heart disease, diabetes, and high blood  pressure?  If you are 18-39 years of age, have your blood pressure checked every 3-5 years. If you are 40 years of age or older, have your blood pressure checked every year. You should have your blood pressure measured twice-once when you are at a hospital or clinic, and once when you are not at a hospital or clinic. Record the average of the two measurements. To check your blood pressure when you are not at a hospital or clinic, you can use: ? An automated blood pressure machine at a pharmacy. ? A home blood pressure monitor.  Talk to your health care provider about your target blood pressure.  If you are between 45-79 years old, ask your health care provider if you should take aspirin to prevent heart disease.  Have regular diabetes screenings by checking your fasting blood sugar level. ? If you are at a normal weight and have a low risk for diabetes, have this test once every three years after the age of 45. ? If you are overweight and have a high risk for diabetes, consider being tested at a younger age or more often.  A one-time screening for abdominal aortic aneurysm (AAA) by ultrasound is recommended for men aged 65-75 years who are current or former smokers. What should I know about preventing infection? Hepatitis B If you have a higher risk for hepatitis B, you should be screened for this virus. Talk with your health care provider to find out if you are at risk for hepatitis B infection. Hepatitis C Blood testing is recommended for:  Everyone born from 1945 through 1965.  Anyone with known risk factors for hepatitis C.  Sexually Transmitted Diseases (STDs)  You should be screened each year for STDs including gonorrhea and chlamydia if: ? You are sexually active and are younger than 44 years of age. ? You are older than 44 years of age and your health care provider tells you that you are at risk for this type of infection. ? Your sexual activity has changed since you were last  screened and you are at an increased risk for chlamydia or gonorrhea. Ask your health care provider if you are at risk.  Talk with your health care provider about whether you are at high risk of being infected with HIV. Your health care provider may recommend a prescription medicine to help prevent HIV infection.  What else can I do?  Schedule regular health, dental, and eye exams.  Stay current with your vaccines (immunizations).  Do not use any tobacco products, such as cigarettes, chewing tobacco, and e-cigarettes. If you need help quitting, ask your health care provider.  Limit alcohol intake to no more than 2 drinks per day. One drink equals 12 ounces of beer, 5 ounces of wine, or 1 ounces of hard liquor.  Do not use street drugs.  Do not share needles.  Ask your health care provider for help if you need support or information about quitting drugs.  Tell your health care   provider if you often feel depressed.  Tell your health care provider if you have ever been abused or do not feel safe at home. This information is not intended to replace advice given to you by your health care provider. Make sure you discuss any questions you have with your health care provider. Document Released: 04/14/2008 Document Revised: 06/15/2016 Document Reviewed: 07/21/2015 Elsevier Interactive Patient Education  2018 [place] (AHA) Exercise Recommendation  Being physically active is important to prevent heart disease and stroke, the nation's No. 1and No. 5killers. To improve overall cardiovascular health, we suggest at least 150 minutes per week of moderate exercise or 75 minutes per week of vigorous exercise (or a combination of moderate and vigorous activity). Thirty minutes a day, five times a week is an easy goal to remember. You will also experience benefits even if you divide your time into two or three segments of 10 to 15 minutes per day.  For people who would  benefit from lowering their blood pressure or cholesterol, we recommend 40 minutes of aerobic exercise of moderate to vigorous intensity three to four times a week to lower the risk for heart attack and stroke.  Physical activity is anything that makes you move your body and burn calories.  This includes things like climbing stairs or playing sports. Aerobic exercises benefit your heart, and include walking, jogging, swimming or biking. Strength and stretching exercises are best for overall stamina and flexibility.  The simplest, positive change you can make to effectively improve your heart health is to start walking. It's enjoyable, free, easy, social and great exercise. A walking program is flexible and boasts high success rates because people can stick with it. It's easy for walking to become a regular and satisfying part of life.   For Overall Cardiovascular Health:  At least 30 minutes of moderate-intensity aerobic activity at least 5 days per week for a total of 150  OR   At least 25 minutes of vigorous aerobic activity at least 3 days per week for a total of 75 minutes; or a combination of moderate- and vigorous-intensity aerobic activity  AND   Moderate- to high-intensity muscle-strengthening activity at least 2 days per week for additional health benefits.  For Lowering Blood Pressure and Cholesterol  An average 40 minutes of moderate- to vigorous-intensity aerobic activity 3 or 4 times per week  What if I can't make it to the time goal? Something is always better than nothing! And everyone has to start somewhere. Even if you've been sedentary for years, today is the day you can begin to make healthy changes in your life. If you don't think you'll make it for 30 or 40 minutes, set a reachable goal for today. You can work up toward your overall goal by increasing your time as you get stronger. Don't let all-or-nothing thinking rob you of doing what you can every day.   Source:http://www.heart.org

## 2017-11-17 NOTE — Progress Notes (Signed)
Dennis Newton 42 y.o.   Chief Complaint  Patient presents with  . Annual Exam    HISTORY OF PRESENT ILLNESS: This is a 42 y.o. male Here for annual exam; no complaints and no medical concerns.   HPI   Prior to Admission medications   Not on File    No Known Allergies  Patient Active Problem List   Diagnosis Date Noted  . BMI 30.0-30.9,adult 09/04/2015  . Pain with ejaculation 02/10/2015  . Rectal bleeding 02/09/2015    Past Medical History:  Diagnosis Date  . Dysuria   . Internal hemorrhoids   . Peyronie's disease   . Urethral stricture     Past Surgical History:  Procedure Laterality Date  . COLONOSCOPY WITH ESOPHAGOGASTRODUODENOSCOPY (EGD)  04-07-2015  propofol  . PENILE BIOPSY N/A 07/20/2015   Procedure: PENILE PLICATION;  Surgeon: Cleon Gustin, MD;  Location: [facility];  Service: Urology;  Laterality: N/A;  . REPAIR OF FRACTURED PENIS N/A 11/05/2016   Procedure: REPAIR OF FRACTURED PENIS;  Surgeon: Nickie Retort, MD;  Location: [facility];  Service: Urology;  Laterality: N/A;    Social History   Socioeconomic History  . Marital status: Married    Spouse name: Barrister's clerk  . Number of children: 4  . Years of education: 11th grade  . Highest education level: Not on file  Social Needs  . Financial resource strain: Not on file  . Food insecurity - worry: Not on file  . Food insecurity - inability: Not on file  . Transportation needs - medical: Not on file  . Transportation needs - non-medical: Not on file  Occupational History  . Occupation: driver  Tobacco Use  . Smoking status: Never Smoker  . Smokeless tobacco: Never Used  Substance and Sexual Activity  . Alcohol use: No    Alcohol/week: 0.0 oz  . Drug use: No  . Sexual activity: Not on file  Other Topics Concern  . Not on file  Social History Narrative   [place]. Came to the [place] in 2002.   Lives with his wife and their 4 children.    Family  History  Problem Relation Age of Onset  . Stroke Mother      Review of Systems  Constitutional: Negative.  Negative for chills, fever and weight loss.  HENT: Negative.  Negative for ear discharge and nosebleeds.   Eyes: Negative.  Negative for blurred vision and double vision.  Respiratory: Negative.  Negative for cough and shortness of breath.   Cardiovascular: Negative.  Negative for chest pain and palpitations.  Gastrointestinal: Negative for abdominal pain, diarrhea, nausea and vomiting.  Genitourinary: Negative.  Negative for hematuria.  Musculoskeletal: Negative.  Negative for myalgias and neck pain.  Skin: Negative.   Neurological: Negative.  Negative for dizziness, focal weakness and headaches.  Endo/Heme/Allergies: Negative.   All other systems reviewed and are negative.  Vitals:   11/17/17 0833  BP: 101/67  Pulse: 66  Resp: 16  Temp: 98 F (36.7 C)  SpO2: 99%     Physical Exam  Constitutional: He is oriented to person, place, and time. He appears well-developed and well-nourished.  HENT:  Head: Normocephalic and atraumatic.  Right Ear: External ear normal.  Left Ear: External ear normal.  Nose: Nose normal.  Mouth/Throat: Oropharynx is clear and moist.  Eyes: Conjunctivae and EOM are normal. Pupils are equal, round, and reactive to light.  Neck: Normal range of motion. Neck supple. No  JVD present. No thyromegaly present.  Cardiovascular: Normal rate, regular rhythm, normal heart sounds and intact distal pulses.  Pulmonary/Chest: Effort normal and breath sounds normal.  Abdominal: Soft. Bowel sounds are normal. He exhibits no distension. There is no tenderness.  Musculoskeletal: Normal range of motion. He exhibits no edema or tenderness.  Lymphadenopathy:    He has no cervical adenopathy.  Neurological: He is alert and oriented to person, place, and time. No sensory deficit. He exhibits normal muscle tone.  Skin: Skin is warm and dry. Capillary refill takes  less than 2 seconds.  Psychiatric: He has a normal mood and affect. His behavior is normal.  Vitals reviewed.    ASSESSMENT & PLAN: Dennis Newton was seen today for annual exam.  Diagnoses and all orders for this visit:  Routine general medical examination at a health care facility -     CBC with Differential -     Comprehensive metabolic panel -     Hemoglobin A1c -     Lipid panel -     PSA(Must document that pt has been informed of limitations of PSA testing.) -     TSH -     HIV antibody    Patient Instructions       IF you received an x-ray today, you will receive an invoice from [facility]. Please contact [facility] at [phone] with questions or concerns regarding your invoice.   IF you received labwork today, you will receive an invoice from [facility]. Please contact [facility] at [phone] with questions or concerns regarding your invoice.   Our billing staff will not be able to assist you with questions regarding bills from these companies.  You will be contacted with the lab results as soon as they are available. The fastest way to get your results is to activate your My Chart account. Instructions are located on the last page of this paperwork. If you have not heard from [place] regarding the results in 2 weeks, please contact this office.      Health Maintenance, Male A healthy lifestyle and preventive care is important for your health and wellness. Ask your health care provider about what schedule of regular examinations is right for you. What should I know about weight and diet? Eat a Healthy Diet  Eat plenty of vegetables, fruits, whole grains, low-fat dairy products, and lean protein.  Do not eat a lot of foods high in solid fats, added sugars, or salt.  Maintain a Healthy Weight Regular exercise can help you achieve or maintain a healthy weight. You should:  Do at least 150 minutes of exercise each week. The exercise should increase  your heart rate and make you sweat (moderate-intensity exercise).  Do strength-training exercises at least twice a week.  Watch Your Levels of Cholesterol and Blood Lipids  Have your blood tested for lipids and cholesterol every 5 years starting at 44 years of age. If you are at high risk for heart disease, you should start having your blood tested when you are 44 years old. You may need to have your cholesterol levels checked more often if: ? Your lipid or cholesterol levels are high. ? You are older than 44 years of age. ? You are at high risk for heart disease.  What should I know about cancer screening? Many types of cancers can be detected early and may often be prevented. Lung Cancer  You should be screened every year for lung cancer if: ? You are a current smoker  who has smoked for at least 30 years. ? You are a former smoker who has quit within the past 15 years.  Talk to your health care provider about your screening options, when you should start screening, and how often you should be screened.  Colorectal Cancer  Routine colorectal cancer screening usually begins at 44 years of age and should be repeated every 5-10 years until you are 44 years old. You may need to be screened more often if early forms of precancerous polyps or small growths are found. Your health care provider may recommend screening at an earlier age if you have risk factors for colon cancer.  Your health care provider may recommend using home test kits to check for hidden blood in the stool.  A small camera at the end of a tube can be used to examine your colon (sigmoidoscopy or colonoscopy). This checks for the earliest forms of colorectal cancer.  Prostate and Testicular Cancer  Depending on your age and overall health, your health care provider may do certain tests to screen for prostate and testicular cancer.  Talk to your health care provider about any symptoms or concerns you have about testicular  or prostate cancer.  Skin Cancer  Check your skin from head to toe regularly.  Tell your health care provider about any new moles or changes in moles, especially if: ? There is a change in a mole's size, shape, or color. ? You have a mole that is larger than a pencil eraser.  Always use sunscreen. Apply sunscreen liberally and repeat throughout the day.  Protect yourself by wearing long sleeves, pants, a wide-brimmed hat, and sunglasses when outside.  What should I know about heart disease, diabetes, and high blood pressure?  If you are 1-49 years of age, have your blood pressure checked every 3-5 years. If you are 68 years of age or older, have your blood pressure checked every year. You should have your blood pressure measured twice-once when you are at a hospital or clinic, and once when you are not at a hospital or clinic. Record the average of the two measurements. To check your blood pressure when you are not at a hospital or clinic, you can use: ? An automated blood pressure machine at a pharmacy. ? A home blood pressure monitor.  Talk to your health care provider about your target blood pressure.  If you are between 68-34 years old, ask your health care provider if you should take aspirin to prevent heart disease.  Have regular diabetes screenings by checking your fasting blood sugar level. ? If you are at a normal weight and have a low risk for diabetes, have this test once every three years after the age of 81. ? If you are overweight and have a high risk for diabetes, consider being tested at a younger age or more often.  A one-time screening for abdominal aortic aneurysm (AAA) by ultrasound is recommended for men aged 63-75 years who are current or former smokers. What should I know about preventing infection? Hepatitis B If you have a higher risk for hepatitis B, you should be screened for this virus. Talk with your health care provider to find out if you are at risk for  hepatitis B infection. Hepatitis C Blood testing is recommended for:  Everyone born from [birth year] through 1967.  Anyone with known risk factors for hepatitis C.  Sexually Transmitted Diseases (STDs)  You should be screened each year for STDs including gonorrhea and  chlamydia if: ? You are sexually active and are younger than 44 years of age. ? You are older than 44 years of age and your health care provider tells you that you are at risk for this type of infection. ? Your sexual activity has changed since you were last screened and you are at an increased risk for chlamydia or gonorrhea. Ask your health care provider if you are at risk.  Talk with your health care provider about whether you are at high risk of being infected with HIV. Your health care provider may recommend a prescription medicine to help prevent HIV infection.  What else can I do?  Schedule regular health, dental, and eye exams.  Stay current with your vaccines (immunizations).  Do not use any tobacco products, such as cigarettes, chewing tobacco, and e-cigarettes. If you need help quitting, ask your health care provider.  Limit alcohol intake to no more than 2 drinks per day. One drink equals 12 ounces of beer, 5 ounces of wine, or 1 ounces of hard liquor.  Do not use street drugs.  Do not share needles.  Ask your health care provider for help if you need support or information about quitting drugs.  Tell your health care provider if you often feel depressed.  Tell your health care provider if you have ever been abused or do not feel safe at home. This information is not intended to replace advice given to you by your health care provider. Make sure you discuss any questions you have with your health care provider. Document Released: 04/14/2008 Document Revised: 06/15/2016 Document Reviewed: 07/21/2015 Elsevier Interactive Patient Education  2018 [place] (AHA) Exercise  Recommendation  Being physically active is important to prevent heart disease and stroke, the nation's No. 1and No. 5killers. To improve overall cardiovascular health, we suggest at least 150 minutes per week of moderate exercise or 75 minutes per week of vigorous exercise (or a combination of moderate and vigorous activity). Thirty minutes a day, five times a week is an easy goal to remember. You will also experience benefits even if you divide your time into two or three segments of 10 to 15 minutes per day.  For people who would benefit from lowering their blood pressure or cholesterol, we recommend 40 minutes of aerobic exercise of moderate to vigorous intensity three to four times a week to lower the risk for heart attack and stroke.  Physical activity is anything that makes you move your body and burn calories.  This includes things like climbing stairs or playing sports. Aerobic exercises benefit your heart, and include walking, jogging, swimming or biking. Strength and stretching exercises are best for overall stamina and flexibility.  The simplest, positive change you can make to effectively improve your heart health is to start walking. It's enjoyable, free, easy, social and great exercise. A walking program is flexible and boasts high success rates because people can stick with it. It's easy for walking to become a regular and satisfying part of life.   For Overall Cardiovascular Health:  At least 30 minutes of moderate-intensity aerobic activity at least 5 days per week for a total of 150  OR   At least 25 minutes of vigorous aerobic activity at least 3 days per week for a total of 75 minutes; or a combination of moderate- and vigorous-intensity aerobic activity  AND   Moderate- to high-intensity muscle-strengthening activity at least 2 days per week for additional health benefits.  For Lowering Blood Pressure and Cholesterol  An average 40 minutes of moderate- to  vigorous-intensity aerobic activity 3 or 4 times per week  What if I can't make it to the time goal? Something is always better than nothing! And everyone has to start somewhere. Even if you've been sedentary for years, today is the day you can begin to make healthy changes in your life. If you don't think you'll make it for 30 or 40 minutes, set a reachable goal for today. You can work up toward your overall goal by increasing your time as you get stronger. Don't let all-or-nothing thinking rob you of doing what you can every day.  Source:[email or web address].[email or web address] Pop, MD [facility]

## 2017-11-18 LAB — CBC WITH DIFFERENTIAL/PLATELET
BASOS: 0 %
Basophils Absolute: 0 10*3/uL (ref 0.0–0.2)
EOS (ABSOLUTE): 0.5 10*3/uL — ABNORMAL HIGH (ref 0.0–0.4)
EOS: 9 %
HEMATOCRIT: 39.5 % (ref 37.5–51.0)
HEMOGLOBIN: 13.7 g/dL (ref 13.0–17.7)
IMMATURE GRANS (ABS): 0 10*3/uL (ref 0.0–0.1)
Immature Granulocytes: 0 %
Lymphocytes Absolute: 2.3 10*3/uL (ref 0.7–3.1)
Lymphs: 41 %
MCH: 29.8 pg (ref 26.6–33.0)
MCHC: 34.7 g/dL (ref 31.5–35.7)
MCV: 86 fL (ref 79–97)
MONOCYTES: 12 %
Monocytes Absolute: 0.7 10*3/uL (ref 0.1–0.9)
NEUTROS ABS: 2.2 10*3/uL (ref 1.4–7.0)
Neutrophils: 38 %
Platelets: 226 10*3/uL (ref 150–379)
RBC: 4.59 x10E6/uL (ref 4.14–5.80)
RDW: 14.3 % (ref 12.3–15.4)
WBC: 5.7 10*3/uL (ref 3.4–10.8)

## 2017-11-18 LAB — COMPREHENSIVE METABOLIC PANEL
A/G RATIO: 1.6 (ref 1.2–2.2)
ALBUMIN: 4.6 g/dL (ref 3.5–5.5)
ALT: 14 IU/L (ref 0–44)
AST: 13 IU/L (ref 0–40)
Alkaline Phosphatase: 103 IU/L (ref 39–117)
BUN / CREAT RATIO: 10 (ref 9–20)
BUN: 12 mg/dL (ref 6–24)
Bilirubin Total: 0.3 mg/dL (ref 0.0–1.2)
CALCIUM: 9.4 mg/dL (ref 8.7–10.2)
CHLORIDE: 102 mmol/L (ref 96–106)
CO2: 21 mmol/L (ref 20–29)
Creatinine, Ser: 1.16 mg/dL (ref 0.76–1.27)
GFR, EST AFRICAN AMERICAN: 89 mL/min/{1.73_m2} (ref >59)
GFR, EST NON AFRICAN AMERICAN: 77 mL/min/{1.73_m2} (ref >59)
Globulin, Total: 2.9 g/dL (ref 1.5–4.5)
Glucose: 92 mg/dL (ref 65–99)
Potassium: 3.9 mmol/L (ref 3.5–5.2)
Sodium: 142 mmol/L (ref 134–144)
TOTAL PROTEIN: 7.5 g/dL (ref 6.0–8.5)

## 2017-11-18 LAB — HEMOGLOBIN A1C
Est. average glucose Bld gHb Est-mCnc: 103 mg/dL
Hgb A1c MFr Bld: 5.2 % (ref 4.8–5.6)

## 2017-11-18 LAB — PSA: Prostate Specific Ag, Serum: 4.1 ng/mL — ABNORMAL HIGH (ref 0.0–4.0)

## 2017-11-18 LAB — LIPID PANEL
CHOL/HDL RATIO: 4.8 ratio (ref 0.0–5.0)
Cholesterol, Total: 226 mg/dL — ABNORMAL HIGH (ref 100–199)
HDL: 47 mg/dL (ref >39)
LDL CALC: 159 mg/dL — AB (ref 0–99)
TRIGLYCERIDES: 101 mg/dL (ref 0–149)
VLDL CHOLESTEROL CAL: 20 mg/dL (ref 5–40)

## 2017-11-18 LAB — HIV ANTIBODY (ROUTINE TESTING W REFLEX): HIV Screen 4th Generation wRfx: NONREACTIVE

## 2017-11-18 LAB — TSH: TSH: 1.33 u[IU]/mL (ref 0.450–4.500)

## 2017-11-20 ENCOUNTER — Other Ambulatory Visit: Payer: Self-pay | Admitting: Emergency Medicine

## 2017-11-20 DIAGNOSIS — R972 Elevated prostate specific antigen [PSA]: Secondary | ICD-10-CM

## 2018-03-22 ENCOUNTER — Encounter: Payer: Self-pay | Admitting: Family Medicine

## 2018-05-08 ENCOUNTER — Ambulatory Visit (INDEPENDENT_AMBULATORY_CARE_PROVIDER_SITE_OTHER): Payer: BLUE CROSS/BLUE SHIELD | Admitting: Emergency Medicine

## 2018-05-08 ENCOUNTER — Other Ambulatory Visit: Payer: Self-pay

## 2018-05-08 ENCOUNTER — Encounter: Payer: Self-pay | Admitting: Emergency Medicine

## 2018-05-08 VITALS — BP 114/73 | HR 74 | Temp 98.5°F | Resp 16 | Ht 73.0 in | Wt 239.0 lb

## 2018-05-08 DIAGNOSIS — M546 Pain in thoracic spine: Secondary | ICD-10-CM | POA: Insufficient documentation

## 2018-05-08 DIAGNOSIS — M79604 Pain in right leg: Secondary | ICD-10-CM

## 2018-05-08 DIAGNOSIS — M7918 Myalgia, other site: Secondary | ICD-10-CM

## 2018-05-08 DIAGNOSIS — E7800 Pure hypercholesterolemia, unspecified: Secondary | ICD-10-CM | POA: Diagnosis not present

## 2018-05-08 MED ORDER — DICLOFENAC SODIUM 75 MG PO TBEC: 75.0000 mg | DELAYED_RELEASE_TABLET | Freq: Two times a day (BID) | ORAL | 0 refills | Status: AC

## 2018-05-08 MED ORDER — CYCLOBENZAPRINE HCL 10 MG PO TABS: 10.0000 mg | ORAL_TABLET | Freq: Every day | ORAL | 0 refills | Status: DC

## 2018-05-08 NOTE — Progress Notes (Signed)
Dennis Newton 42 y.o.   Chief Complaint  Patient presents with  . Flank Pain    RIGHT side down the right leg x 1 week and cholesterol check    HISTORY OF PRESENT ILLNESS: This is a 42 y.o. male complaining of pain to right flank area and right leg for 1 week.  Denies injury.  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  Pain is sharp and constant and worse with movement, better with rest. No associated symptoms. Also needs a cholesterol recheck.  Cholesterol levels were elevated 6 months ago. HPI   Prior to Admission medications   Medication Sig Start Date End Date Taking? Authorizing Provider  tamsulosin (FLOMAX) 0.4 MG CAPS capsule Take 0.4 mg by mouth at bedtime.   Yes [provider]    No Known Allergies  Patient Active Problem List   Diagnosis Date Noted  . BMI 30.0-30.9,adult 09/04/2015  . Pain with ejaculation 02/10/2015  . Rectal bleeding 02/09/2015    Past Medical History:  Diagnosis Date  . Dysuria   . Internal hemorrhoids   . Peyronie's disease   . Urethral stricture     Past Surgical History:  Procedure Laterality Date  . COLONOSCOPY WITH ESOPHAGOGASTRODUODENOSCOPY (EGD)  04-07-2015  propofol  . PENILE BIOPSY N/A 07/20/2015   Procedure: PENILE PLICATION;  Surgeon: Cleon Gustin, MD;  Location: [facility];  Service: Urology;  Laterality: N/A;  . REPAIR OF FRACTURED PENIS N/A 11/05/2016   Procedure: REPAIR OF FRACTURED PENIS;  Surgeon: Nickie Retort, MD;  Location: [facility];  Service: Urology;  Laterality: N/A;    Social History   Socioeconomic History  . Marital status: Married    Spouse name: Barrister's clerk  . Number of children: 4  . Years of education: 11th grade  . Highest education level: Not on file  Occupational History  . Occupation: driver  Social Needs  . Financial resource strain: Not on file  . Food insecurity:    Worry: Not on file    Inability: Not on file  .  Transportation needs:    Medical: Not on file    Non-medical: Not on file  Tobacco Use  . Smoking status: Never Smoker  . Smokeless tobacco: Never Used  Substance and Sexual Activity  . Alcohol use: No    Alcohol/week: 0.0 oz  . Drug use: No  . Sexual activity: Not on file  Lifestyle  . Physical activity:    Days per week: Not on file    Minutes per session: Not on file  . Stress: Not on file  Relationships  . Social connections:    Talks on phone: Not on file    Gets together: Not on file    Attends religious service: Not on file    Active member of club or organization: Not on file    Attends meetings of clubs or organizations: Not on file    Relationship status: Not on file  . Intimate partner violence:    Fear of current or ex partner: Not on file    Emotionally abused: Not on file    Physically abused: Not on file    Forced sexual activity: Not on file  Other Topics Concern  . Not on file  Social History Narrative   From [place], in [place]. Came to the [place] in 2002.   Lives with his wife and their 4 children.    Family History  Problem  Relation Age of Onset  . Stroke Mother      Review of Systems  Constitutional: Negative.  Negative for chills and fever.  HENT: Negative.  Negative for congestion and sore throat.   Eyes: Negative.   Respiratory: Negative.  Negative for cough.   Cardiovascular: Negative.  Negative for chest pain and palpitations.  Gastrointestinal: Negative.  Negative for abdominal pain, diarrhea, nausea and vomiting.  Genitourinary: Negative.  Negative for dysuria and hematuria.  Musculoskeletal: Positive for back pain. Negative for myalgias and neck pain.       Pain behind the right knee  Skin: Negative.  Negative for rash.  Neurological: Negative.  Negative for dizziness and headaches.  Endo/Heme/Allergies: Negative.   All other systems reviewed and are negative.  Vitals:   05/08/18 1518  BP: 114/73  Pulse: 74  Resp: 16  Temp: 98.5  F (36.9 C)  SpO2: 98%     Physical Exam  Constitutional: He is oriented to person, place, and time. He appears well-developed and well-nourished.  HENT:  Head: Normocephalic and atraumatic.  Nose: Nose normal.  Mouth/Throat: Oropharynx is clear and moist.  Eyes: Pupils are equal, round, and reactive to light. Conjunctivae and EOM are normal.  Neck: Normal range of motion. Neck supple.  Cardiovascular: Normal rate, regular rhythm and normal heart sounds.  Pulmonary/Chest: Effort normal and breath sounds normal.  Abdominal: Soft. Bowel sounds are normal. He exhibits no distension. There is no tenderness.  Musculoskeletal: Normal range of motion. He exhibits no edema or tenderness.  Positive tenderness to right thoracic area muscles in the back. Positive tenderness to tendons behind right knee.  Full range of motion.  Neurological: He is alert and oriented to person, place, and time. No sensory deficit. He exhibits normal muscle tone.  Skin: Skin is warm and dry. Capillary refill takes less than 2 seconds.  Psychiatric: He has a normal mood and affect.  Vitals reviewed.  A total of 25 minutes was spent in the room with the patient, greater than 50% of which was in counseling/coordination of care regarding differential diagnosis, treatment, medications, and need for follow-up if no better or worse.   ASSESSMENT & PLAN: Dennis Newton was seen today for flank pain.  Diagnoses and all orders for this visit:  Acute right-sided thoracic back pain -     cyclobenzaprine (FLEXERIL) 10 MG tablet; Take 1 tablet (10 mg total) by mouth at bedtime. -     diclofenac (VOLTAREN) 75 MG EC tablet; Take 1 tablet (75 mg total) by mouth 2 (two) times daily for 5 days. After 5 days take as needed.  Elevated cholesterol -     Lipid panel; Future -     Lipid panel  Musculoskeletal pain -     cyclobenzaprine (FLEXERIL) 10 MG tablet; Take 1 tablet (10 mg total) by mouth at bedtime. -     diclofenac  (VOLTAREN) 75 MG EC tablet; Take 1 tablet (75 mg total) by mouth 2 (two) times daily for 5 days. After 5 days take as needed.  Right leg pain    Patient Instructions       IF you received an x-ray today, you will receive an invoice from [facility]. Please contact [facility] at [phone] with questions or concerns regarding your invoice.   IF you received labwork today, you will receive an invoice from [facility]. Please contact [facility] at [phone] with questions or concerns regarding your invoice.   Our billing staff will not be able to assist  you with questions regarding bills from these companies.  You will be contacted with the lab results as soon as they are available. The fastest way to get your results is to activate your My Chart account. Instructions are located on the last page of this paperwork. If you have not heard from [place] regarding the results in 2 weeks, please contact this office.     Musculoskeletal Pain Musculoskeletal pain is muscle and bone aches and pains. This pain can occur in any part of the body. Follow these instructions at home:  Only take medicines for pain, discomfort, or fever as told by your health care provider.  You may continue all activities unless the activities cause more pain. When the pain lessens, slowly resume normal activities. Gradually increase the intensity and duration of the activities or exercise.  During periods of severe pain, bed rest may be helpful. Lie or sit in any position that is comfortable, but get out of bed and walk around at least every several hours.  If directed, put ice on the injured area. ? Put ice in a plastic bag. ? Place a towel between your skin and the bag. ? Leave the ice on for 20 minutes, 2-3 times a day. Contact a health care provider if:  Your pain is getting worse.  Your pain is not relieved with medicines.  You lose function in the area of the pain if the pain is in your  arms, legs, or neck. This information is not intended to replace advice given to you by your health care provider. Make sure you discuss any questions you have with your health care provider. Document Released: 10/17/2005 Document Revised: 03/29/2016 Document Reviewed: 06/21/2013 Elsevier Interactive Patient Education  2017 [employer]vier Inc[employer]      Agustina Caroli, MD [facility]

## 2018-05-08 NOTE — Patient Instructions (Addendum)
     IF you received an x-ray today, you will receive an invoice from [facility]. Please contact [facility], [facility], [facility] at [phone] with questions or concerns regarding your invoice.   IF you received labwork today, you will receive an invoice from [facility]. Please contact [facility] at [phone] with questions or concerns regarding your invoice.   Our billing staff will not be able to assist you with questions regarding bills from these companies.  You will be contacted with the lab results as soon as they are available. The fastest way to get your results is to activate your My Chart account. Instructions are located on the last page of this paperwork. If you have not heard from [place] regarding the results in 2 weeks, please contact this office.     Musculoskeletal Pain Musculoskeletal pain is muscle and bone aches and pains. This pain can occur in any part of the body. Follow these instructions at home:  Only take medicines for pain, discomfort, or fever as told by your health care provider.  You may continue all activities unless the activities cause more pain. When the pain lessens, slowly resume normal activities. Gradually increase the intensity and duration of the activities or exercise.  During periods of severe pain, bed rest may be helpful. Lie or sit in any position that is comfortable, but get out of bed and walk around at least every several hours.  If directed, put ice on the injured area. ? Put ice in a plastic bag. ? Place a towel between your skin and the bag. ? Leave the ice on for 20 minutes, 2-3 times a day. Contact a health care provider if:  Your pain is getting worse.  Your pain is not relieved with medicines.  You lose function in the area of the pain if the pain is in your arms, legs, or neck. This information is not intended to replace advice given to you by your health care provider. Make sure you discuss any questions you have with your health  care provider. Document Released: 10/17/2005 Document Revised: 03/29/2016 Document Reviewed: 06/21/2013 Elsevier Interactive Patient Education  2017 [employer].

## 2018-05-10 LAB — LIPID PANEL
CHOL/HDL RATIO: 4.1 ratio (ref 0.0–5.0)
CHOLESTEROL TOTAL: 192 mg/dL (ref 100–199)
HDL: 47 mg/dL (ref >39)
LDL CALC: 136 mg/dL — AB (ref 0–99)
TRIGLYCERIDES: 46 mg/dL (ref 0–149)
VLDL CHOLESTEROL CAL: 9 mg/dL (ref 5–40)

## 2018-05-11 ENCOUNTER — Encounter: Payer: Self-pay | Admitting: Radiology

## 2018-10-03 ENCOUNTER — Ambulatory Visit: Payer: Self-pay | Admitting: Radiation Oncology

## 2018-10-03 ENCOUNTER — Ambulatory Visit: Payer: Self-pay

## 2018-10-10 ENCOUNTER — Ambulatory Visit: Admission: RE | Admit: 2018-10-10 | Payer: Self-pay | Source: Ambulatory Visit | Admitting: Radiation Oncology

## 2018-10-10 ENCOUNTER — Ambulatory Visit: Payer: Self-pay | Attending: Radiation Oncology

## 2018-10-17 ENCOUNTER — Encounter: Payer: Self-pay | Admitting: Radiation Oncology

## 2018-10-17 NOTE — Progress Notes (Signed)
GU Location of Tumor / Histology: prostatic adenocarcinoma  If Prostate Cancer, Gleason Score is (3 + 3) and PSA is (3.9). Prostate volume: 36 cc  Dennis Newton had constant dysuria x 2-3 months. PSA collected 12/15/2017 and found to be 4.1. PSA was checked again Mar 08, 2018 after antibiotic therapy. In May he reported weak urine stream, dysuria and difficulty emptying his bladder. Then, PSA in August 2019 was unchanged at 3.92. Patient proceeded with prostate biopsy on 08/17/2018.  Biopsies of prostate (if applicable) revealed:    Past/Anticipated interventions by urology, if any: Xiaflex [id number] complicated by penile fracture requiring surgery. Prostate biopsy. Referral to Dr. Tresa Moore for consideration of prostatectomy. Referral to Dr. Tammi Klippel for consideration of radiotherapy.  Past/Anticipated interventions by medical oncology, if any: no  Weight changes, if any: no  Bowel/Bladder complaints, if any:  IPSS 11. SHIM 10. Denies hematuria. Denies urinary leakage or incontinence. Reports dysuria.   Nausea/Vomiting, if any: no  Pain issues, if any:  no  SAFETY ISSUES:  Prior radiation? no  Pacemaker/ICD? no  Possible current pregnancy? no  Is the patient on methotrexate? no  Current Complaints / other details:  42 year old male. Married with 4 children. From [place]. 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

## 2018-10-18 ENCOUNTER — Encounter: Payer: Self-pay | Admitting: Radiation Oncology

## 2018-10-18 ENCOUNTER — Other Ambulatory Visit: Payer: Self-pay

## 2018-10-18 ENCOUNTER — Ambulatory Visit
Admission: RE | Admit: 2018-10-18 | Discharge: 2018-10-18 | Disposition: A | Discharge status: Home / Self Care | Payer: Self-pay | Source: Ambulatory Visit | Attending: Radiation Oncology | Admitting: Radiation Oncology

## 2018-10-18 ENCOUNTER — Telehealth: Payer: Self-pay | Admitting: Radiation Oncology

## 2018-10-18 ENCOUNTER — Encounter: Payer: Self-pay | Admitting: Medical Oncology

## 2018-10-18 VITALS — BP 113/79 | HR 54 | Temp 98.0°F | Resp 20 | Ht 75.0 in | Wt 246.8 lb

## 2018-10-18 DIAGNOSIS — C61 Malignant neoplasm of prostate: Secondary | ICD-10-CM

## 2018-10-18 DIAGNOSIS — R531 Weakness: Secondary | ICD-10-CM | POA: Insufficient documentation

## 2018-10-18 DIAGNOSIS — N486 Induration penis plastica: Secondary | ICD-10-CM | POA: Insufficient documentation

## 2018-10-18 DIAGNOSIS — Z79899 Other long term (current) drug therapy: Secondary | ICD-10-CM | POA: Insufficient documentation

## 2018-10-18 DIAGNOSIS — N35919 Unspecified urethral stricture, male, unspecified site: Secondary | ICD-10-CM | POA: Insufficient documentation

## 2018-10-18 HISTORY — DX: Malignant neoplasm of prostate: C61

## 2018-10-18 NOTE — Progress Notes (Signed)
Introduced myself to patient as the prostate nurse navigator and my role. He is not aware of any family history of prostate cancer or breast cancer in his family. He has met with surgeon at [facility] and has a follow up in January. He is here today to learn about his radiation options. He is returning to [place] after the first of the year and will make his decision after he returns. I gave him my business card and asked him to call me with questions or concerns. He voiced understanding.

## 2018-10-18 NOTE — Progress Notes (Signed)
Radiation Oncology         [phone]) [phone] ________________________________  Initial outpatient Consultation  Name: Dennis Newton MRN: [id number]  Date: 10/18/2018  DOB: 01/22/1976  MW:[id number], No Pcp Per  Alexis Frock, MD   REFERRING PHYSICIAN: Alexis Frock, MD  DIAGNOSIS: 42 y.o. gentleman with Stage T1c adenocarcinoma of the prostate with Gleason score of 3+3, and PSA of 3.9.    ICD-10-CM   1. Malignant neoplasm of prostate ([place]) C61     HISTORY OF PRESENT ILLNESS: Dennis Newton is a 42 y.o. male with a diagnosis of prostate cancer. He is an established patient at [facility] with a history of treated Peyronie's disease. He presented with constant dysuria for 2 to 3 months in 12/2017. At the time, he was noted to have an elevated PSA of 4.1 by  Dr. Alyson Ingles. In 02/2018, he reported weak urine stream, dysuria, and difficulty emptying his bladder. He was treated with antibiotics on 03/08/18. PSA in 05/2018 remained unchanged at 3.92. Digital rectal examination was performed at time of biopsy, showing no nodules.  The patient proceeded to transrectal ultrasound with 12 biopsies of the prostate on 08/28/18.  The prostate volume measured 35.54 cc.  Out of 12 core biopsies, 4 were positive.  The maximum Gleason score was 3+3, and this was seen in right mid, right lid lateral, right apex, and right apex lateral. He is scheduled to see Dr. Tresa Moore on 11/02/18.  The patient reviewed the biopsy results with his urologist and he has kindly been referred today for discussion of potential radiation treatment options.   PREVIOUS RADIATION THERAPY: No  PAST MEDICAL HISTORY:  Past Medical History:  Diagnosis Date  . Dysuria   . Internal hemorrhoids   . Peyronie's disease   . Prostate cancer ([place])   . Urethral stricture       PAST SURGICAL HISTORY: Past Surgical History:  Procedure Laterality Date  . COLONOSCOPY WITH ESOPHAGOGASTRODUODENOSCOPY (EGD)  04-07-2015  propofol  . PENILE  BIOPSY N/A 07/20/2015   Procedure: PENILE PLICATION;  Surgeon: Cleon Gustin, MD;  [facility];  Service: Urology;  Laterality: N/A;  . PROSTATE BIOPSY    . REPAIR OF FRACTURED PENIS N/A 11/05/2016   Procedure: REPAIR OF FRACTURED PENIS;  Surgeon: Nickie Retort, MD;  Location: [facility];  Service: Urology;  Laterality: N/A;    FAMILY HISTORY:  Family History  Problem Relation Age of Onset  . Stroke Mother   . Breast cancer Neg Hx   . Prostate cancer Neg Hx   . Colon cancer Neg Hx   . Pancreatic cancer Neg Hx     SOCIAL HISTORY:  Social History   Socioeconomic History  . Marital status: Married    Spouse name: Barrister's clerk  . Number of children: 4  . Years of education: 11th grade  . Highest education level: Not on file  Occupational History  . Occupation: driver    Comment: long distance truck driver  Social Needs  . Financial resource strain: Not on file  . Food insecurity:    Worry: Not on file    Inability: Not on file  . Transportation needs:    Medical: Not on file    Non-medical: Not on file  Tobacco Use  . Smoking status: Never Smoker  . Smokeless tobacco: Never Used  Substance and Sexual Activity  . Alcohol use: No    Alcohol/week: 0.0 standard drinks  . Drug use: No  . Sexual activity: Yes  Lifestyle  . Physical activity:    Days per week: Not on file    Minutes per session: Not on file  . Stress: Not on file  Relationships  . Social connections:    Talks on phone: Not on file    Gets together: Not on file    Attends religious service: Not on file    Active member of club or organization: Not on file    Attends meetings of clubs or organizations: Not on file    Relationship status: Not on file  . Intimate partner violence:    Fear of current or ex partner: Not on file    Emotionally abused: Not on file    Physically abused: Not on file    Forced sexual activity: Not on file  Other Topics Concern  . Not on file    Social History Narrative   From [place], in [place]. Came to the [place] in 2002.   Lives with his wife and their 4 children.    ALLERGIES: Patient has no known allergies.  MEDICATIONS:  Current Outpatient Medications  Medication Sig Dispense Refill  . cyclobenzaprine (FLEXERIL) 10 MG tablet Take 1 tablet (10 mg total) by mouth at bedtime. 30 tablet 0  . tamsulosin (FLOMAX) 0.4 MG CAPS capsule Take 0.4 mg by mouth at bedtime.     No current facility-administered medications for this encounter.     REVIEW OF SYSTEMS:  On review of systems, the patient reports that he is doing well overall. He denies any chest pain, shortness of breath, cough, fevers, chills, night sweats, unintended weight changes. He denies any bowel disturbances, and denies abdominal pain, nausea or vomiting. He denies any new musculoskeletal or joint aches or pains. His IPSS was 11, indicating moderate urinary symptoms. He reports dysuria, and denies hematuria and urinary leakage or incontinence. His SHIM was 10, indicating he does have erectile dysfunction. A complete review of systems is obtained and is otherwise negative.    PHYSICAL EXAM:  Wt Readings from Last 3 Encounters:  10/18/18 246 lb 12.8 oz (111.9 kg)  05/08/18 239 lb (108.4 kg)  11/17/17 236 lb 12.8 oz (107.4 kg)   Temp Readings from Last 3 Encounters:  10/18/18 98 F (36.7 C) (Oral)  05/08/18 98.5 F (36.9 C) (Oral)  11/17/17 98 F (36.7 C) (Oral)   BP Readings from Last 3 Encounters:  10/18/18 113/79  05/08/18 114/73  11/17/17 101/67   Pulse Readings from Last 3 Encounters:  10/18/18 (!) 54  05/08/18 74  11/17/17 66   Pain Assessment Pain Score: 0-No pain/10  In general this is a well appearing African American gentleman in no acute distress. He is alert and oriented x4 and appropriate throughout the examination. HEENT reveals that the patient is normocephalic, atraumatic. EOMs are intact. PERRLA. Skin is intact without any evidence of  gross lesions. Cardiovascular exam reveals a regular rate and rhythm, no clicks rubs or murmurs are auscultated. Chest is clear to auscultation bilaterally. Lymphatic assessment is performed and does not reveal any adenopathy in the cervical, supraclavicular, axillary, or inguinal chains. Abdomen has active bowel sounds in all quadrants and is intact. The abdomen is soft, non tender, non distended. Lower extremities are negative for pretibial pitting edema, deep calf tenderness, cyanosis or clubbing.   KPS = 90  100 - Normal; no complaints; no evidence of disease. 90   - Able to carry on normal activity; minor signs or symptoms of disease. 80   - Normal  activity with effort; some signs or symptoms of disease. 36   - Cares for self; unable to carry on normal activity or to do active work. 60   - Requires occasional assistance, but is able to care for most of his personal needs. 50   - Requires considerable assistance and frequent medical care. 74   - Disabled; requires special care and assistance. 47   - Severely disabled; hospital admission is indicated although death not imminent. 77   - Very sick; hospital admission necessary; active supportive treatment necessary. 10   - Moribund; fatal processes progressing rapidly. 0     - Dead  Karnofsky DA, Abelmann Poole, Craver LS and Burchenal [facility] [phone]) The use of the nitrogen mustards in the palliative treatment of carcinoma: with particular reference to bronchogenic carcinoma Cancer 1 634-56  LABORATORY DATA:  Lab Results  Component Value Date   WBC 5.7 11/17/2017   HGB 13.7 11/17/2017   HCT 39.5 11/17/2017   MCV 86 11/17/2017   PLT 226 11/17/2017   Lab Results  Component Value Date   NA 142 11/17/2017   K 3.9 11/17/2017   CL 102 11/17/2017   CO2 21 11/17/2017   Lab Results  Component Value Date   ALT 14 11/17/2017   AST 13 11/17/2017   ALKPHOS 103 11/17/2017   BILITOT 0.3 11/17/2017     RADIOGRAPHY: No results found.      IMPRESSION/PLAN: 1. 42 y.o. gentleman with Stage T1c adenocarcinoma of the prostate with Gleason Score of 3+3, and PSA of 3.92. We discussed the patient's workup and outlines the nature of prostate cancer in this setting. The patient's T stage, Gleason's score, and PSA put him into the low risk group. Accordingly, he is eligible for a variety of potential treatment options including brachytherapy, external radiation, or prostatectomy. We discussed the available radiation techniques, and focused on the details and logistics and delivery. We discussed and outlined the risks, benefits, short and long-term effects associated with radiotherapy and compared and contrasted these with prostatectomy.   He states he will be going back to [place] to take his mother home at the end of January and return in April. He will meet with Dr. Tresa Moore on 11/02/18 to discuss his surgery options and make a decision when he gets back from his trip.  We spent time face to face with the patient and more than 50% of that time was spent in counseling and/or coordination of care.   _____________________________________  Sheral Apley. Tammi Klippel, M.D.   This document serves as a record of services personally performed by Tyler Pita, MD. It was created on his behalf by Wilburn Mylar, a trained medical scribe. The creation of this record is based on the scribe's personal observations and the provider's statements to them. This document has been checked and approved by the attending provider.

## 2018-10-18 NOTE — Telephone Encounter (Signed)
Patient has not shown for 1430 appointment. Phoned to inquire. Patient verbalizes he is 15 minutes out. Instructed patient to come on in for evaluation. Will continue to watch for patient to arrive.

## 2018-10-18 NOTE — Progress Notes (Signed)
See progress note under physician encounter. 

## 2018-10-19 DIAGNOSIS — C61 Malignant neoplasm of prostate: Secondary | ICD-10-CM | POA: Insufficient documentation

## 2020-01-01 ENCOUNTER — Other Ambulatory Visit: Payer: Self-pay

## 2020-01-01 ENCOUNTER — Encounter (HOSPITAL_COMMUNITY): Payer: Self-pay | Admitting: *Deleted

## 2020-01-01 ENCOUNTER — Emergency Department (HOSPITAL_COMMUNITY)
Admission: EM | Admit: 2020-01-01 | Discharge: 2020-01-02 | Disposition: A | Discharge status: Home / Self Care | Payer: BLUE CROSS/BLUE SHIELD | Attending: Emergency Medicine | Admitting: Emergency Medicine

## 2020-01-01 DIAGNOSIS — Z8546 Personal history of malignant neoplasm of prostate: Secondary | ICD-10-CM | POA: Insufficient documentation

## 2020-01-01 DIAGNOSIS — H608X1 Other otitis externa, right ear: Secondary | ICD-10-CM | POA: Insufficient documentation

## 2020-01-01 DIAGNOSIS — H60501 Unspecified acute noninfective otitis externa, right ear: Secondary | ICD-10-CM

## 2020-01-01 DIAGNOSIS — Z79899 Other long term (current) drug therapy: Secondary | ICD-10-CM | POA: Insufficient documentation

## 2020-01-01 DIAGNOSIS — H9201 Otalgia, right ear: Secondary | ICD-10-CM | POA: Diagnosis present

## 2020-01-01 NOTE — ED Triage Notes (Signed)
The pt has had an earache since yesterday no temperature

## 2020-01-02 MED ORDER — NEOMYCIN-POLYMYXIN-HC 3.5-10000-1 OT SUSP: 4.0000 [drp] | Freq: Three times a day (TID) | OTIC | 0 refills | Status: AC

## 2020-01-02 MED ORDER — NEOMYCIN-POLYMYXIN-HC 3.5-10000-1 OT SUSP: 4.0000 [drp] | Freq: Three times a day (TID) | OTIC | 0 refills | Status: DC

## 2020-01-02 NOTE — ED Provider Notes (Signed)
[facility] EMERGENCY DEPARTMENT Provider Note   CSN: [id number] Arrival date & time: 01/01/20  2102     History Chief Complaint  Patient presents with  . Otalgia    Dennis Newton is a 44 y.o. male resenting for evaluation of right ear pain.  Patient states yesterday he developed right ear pain.  Pain has gradually worsened throughout today.  He has not taken anything for pain including Tylenol ibuprofen.  Patient states everything sounds muffled in the right ear.  He is not having no pain on the left side.  He denies fevers, chills, nasal congestion, sore throat.  He denies dental pain.  He denies history of similar.  He uses Q-tips occasionally.  No recent swimming or trauma to the ear.  He has no other medical problems, takes medications daily.  Additional history obtained from chart review.  It appears patient was diagnosed with prostate cancer in 09/2018, but did not undergo treatment.   HPI     Past Medical History:  Diagnosis Date  . Dysuria   . Internal hemorrhoids   . Peyronie's disease   . Prostate cancer ([place])   . Urethral stricture     Patient Active Problem List   Diagnosis Date Noted  . Malignant neoplasm of prostate ([place]) 10/19/2018  . Elevated cholesterol 05/08/2018  . Acute right-sided thoracic back pain 05/08/2018  . Right leg pain 05/08/2018  . Musculoskeletal pain 05/08/2018  . BMI 30.0-30.9,adult 09/04/2015  . Pain with ejaculation 02/10/2015  . Rectal bleeding 02/09/2015    Past Surgical History:  Procedure Laterality Date  . COLONOSCOPY WITH ESOPHAGOGASTRODUODENOSCOPY (EGD)  04-07-2015  propofol  . PENILE BIOPSY N/A 07/20/2015   Procedure: PENILE PLICATION;  Surgeon: Cleon Gustin, MD;  Location[facility];  Service: Urology;  Laterality: N/A;  . PROSTATE BIOPSY    . REPAIR OF FRACTURED PENIS N/A 11/05/2016   Procedure: REPAIR OF FRACTURED PENIS;  Surgeon: Nickie Retort, MD;  Location: [facility];   Service: Urology;  Laterality: N/A;       Family History  Problem Relation Age of Onset  . Stroke Mother   . Breast cancer Neg Hx   . Prostate cancer Neg Hx   . Colon cancer Neg Hx   . Pancreatic cancer Neg Hx     Social History   Tobacco Use  . Smoking status: Never Smoker  . Smokeless tobacco: Never Used  Substance Use Topics  . Alcohol use: No    Alcohol/week: 0.0 standard drinks  . Drug use: No    Home Medications Prior to Admission medications   Medication Sig Start Date End Date Taking? Authorizing Provider  cyclobenzaprine (FLEXERIL) 10 MG tablet Take 1 tablet (10 mg total) by mouth at bedtime. 05/08/18   Horald Pollen, MD  neomycin-polymyxin-hydrocortisone (CORTISPORIN) 3.5-10000-1 OTIC suspension Place 4 drops into the right ear 3 (three) times daily for 5 days. 01/02/20 01/07/20  , , PA-C  tamsulosin (FLOMAX) 0.4 MG CAPS capsule Take 0.4 mg by mouth at bedtime.    [provider]    Allergies    Patient has no known allergies.  Review of Systems   Review of Systems  Constitutional: Negative for fever.  HENT: Positive for ear pain.     Physical Exam Updated Vital Signs BP 129/82 (BP Location: Right Arm)   Pulse 62   Temp 98.5 F (36.9 C) (Oral)   Resp 18   Ht 6\' 3"  (1.905 m)  Wt 111.9 kg   SpO2 98%   BMI 30.83 kg/m   Physical Exam Vitals and nursing note reviewed.  Constitutional:      General: He is not in acute distress.    Appearance: He is well-developed.     Comments: Sitting comfortably in the bed in no acute distress  HENT:     Head: Normocephalic and atraumatic.     Left Ear: Tympanic membrane, ear canal and external ear normal.     Ears:     Comments: Right ear canal edematous with white purulent material.  No drainage at the exit of the canal.  TM not visualized due to swelling.  No tenderness to the mastoid Left TM and ear canal normal    Mouth/Throat:     Comments: OP clear without tonsillar swelling  or exudate Eyes:     Extraocular Movements: Extraocular movements intact.     Conjunctiva/sclera: Conjunctivae normal.     Pupils: Pupils are equal, round, and reactive to light.  Pulmonary:     Effort: Pulmonary effort is normal.  Abdominal:     General: There is no distension.  Musculoskeletal:        General: Normal range of motion.     Cervical back: Normal range of motion.  Skin:    General: Skin is warm.     Findings: No rash.  Neurological:     Mental Status: He is alert and oriented to person, place, and time.     ED Results / Procedures / Treatments   Labs (all labs ordered are listed, but only abnormal results are displayed) Labs Reviewed - No data to display  EKG None  Radiology No results found.  Procedures Procedures (including critical care time)  Medications Ordered in ED Medications - No data to display  ED Course  I have reviewed the triage vital signs and the nursing notes.  Pertinent labs & imaging results that were available during my care of the patient were reviewed by me and considered in my medical decision making (see chart for details).    MDM Rules/Calculators/A&P                      Patient presenting for evaluation of right ear pain.  Exam is consistent with otitis externa.  No mastoid tenderness to indicate mastoiditis.  No signs of systemic infection.  Will prescribe eardrops, have patient use Tylenol and ibuprofen as needed for pain.  At this time, patient appears safe for discharge.  Return precautions given.  Patient states he understands and agrees to plan.  Final Clinical Impression(s) / ED Diagnoses Final diagnoses:  Acute otitis externa of right ear, unspecified type    Rx / DC Orders ED Discharge Orders         Ordered    neomycin-polymyxin-hydrocortisone (CORTISPORIN) 3.5-10000-1 OTIC suspension  3 times daily     01/02/20 0412           Franchot Heidelberg, PA-C 01/02/20 0413    Mesner, Corene Cornea, MD 01/02/20  [phone]

## 2020-01-02 NOTE — ED Notes (Signed)
Pt was discharged from the ED. Pt read and understood discharge paperwork. Pt had vital signs completed. Pt conscious, breathing, and A&Ox4. No distress noted. Pt speaking in complete sentences. Pt ambulated out of the ED with a smooth and steady gait. E-signature not available.  

## 2020-01-02 NOTE — Discharge Instructions (Signed)
Use eardrops as prescribed. Use Tylenol and ibuprofen as needed for pain. Return to the emergency room if you develop high fevers, severe worsening pain, or any new, worsening, or concerning symptoms.

## 2020-06-27 DIAGNOSIS — R972 Elevated prostate specific antigen [PSA]: Secondary | ICD-10-CM | POA: Insufficient documentation

## 2020-10-30 ENCOUNTER — Other Ambulatory Visit: Payer: Self-pay

## 2020-10-30 ENCOUNTER — Emergency Department (HOSPITAL_COMMUNITY)
Admission: EM | Admit: 2020-10-30 | Discharge: 2020-10-30 | Disposition: A | Discharge status: Home / Self Care | Payer: BLUE CROSS/BLUE SHIELD | Attending: Emergency Medicine | Admitting: Emergency Medicine

## 2020-10-30 DIAGNOSIS — U071 COVID-19: Secondary | ICD-10-CM | POA: Diagnosis not present

## 2020-10-30 DIAGNOSIS — R059 Cough, unspecified: Secondary | ICD-10-CM | POA: Diagnosis present

## 2020-10-30 DIAGNOSIS — Z8546 Personal history of malignant neoplasm of prostate: Secondary | ICD-10-CM | POA: Insufficient documentation

## 2020-10-30 LAB — RESP PANEL BY RT-PCR (FLU A&B, COVID) ARPGX2
Influenza A by PCR: NEGATIVE
Influenza B by PCR: NEGATIVE
SARS Coronavirus 2 by RT PCR: POSITIVE — AB

## 2020-10-30 NOTE — ED Provider Notes (Signed)
[facility] EMERGENCY DEPARTMENT Provider Note   CSN: [id number] Arrival date & time: 10/30/20  0005     History Chief Complaint  Patient presents with  . Covid Exposure    Dennis Newton is a 44 y.o. male.  Patient presents to the emergency department with concerns over Covid infection.  Patient is vaccinated.  Patient reports that he developed cough and sore throat yesterday.  His daughter is currently positive for Covid.  No shortness of breath.        Past Medical History:  Diagnosis Date  . Dysuria   . Internal hemorrhoids   . Peyronie's disease   . Prostate cancer (HCC)   . Urethral stricture     Patient Active Problem List   Diagnosis Date Noted  . Malignant neoplasm of prostate (HCC) 10/19/2018  . Elevated cholesterol 05/08/2018  . Acute right-sided thoracic back pain 05/08/2018  . Right leg pain 05/08/2018  . Musculoskeletal pain 05/08/2018  . BMI 30.0-30.9,adult 09/04/2015  . Pain with ejaculation 02/10/2015  . Rectal bleeding 02/09/2015    Past Surgical History:  Procedure Laterality Date  . COLONOSCOPY WITH ESOPHAGOGASTRODUODENOSCOPY (EGD)  04-07-2015  propofol  . PENILE BIOPSY N/A 07/20/2015   Procedure: PENILE PLICATION;  Surgeon: Malen Gauze, MD;  Location: [facility];  Service: Urology;  Laterality: N/A;  . PROSTATE BIOPSY    . REPAIR OF FRACTURED PENIS N/A 11/05/2016   Procedure: REPAIR OF FRACTURED PENIS;  Surgeon: Hildred Laser, MD;  Location: [facility];  Service: Urology;  Laterality: N/A;       Family History  Problem Relation Age of Onset  . Stroke Mother   . Breast cancer Neg Hx   . Prostate cancer Neg Hx   . Colon cancer Neg Hx   . Pancreatic cancer Neg Hx     Social History   Tobacco Use  . Smoking status: Never Smoker  . Smokeless tobacco: Never Used  Vaping Use  . Vaping Use: Never used  Substance Use Topics  . Alcohol use: No    Alcohol/week: 0.0 standard drinks  . Drug  use: No    Home Medications Prior to Admission medications   Medication Sig Start Date End Date Taking? Authorizing Provider  cyclobenzaprine (FLEXERIL) 10 MG tablet Take 1 tablet (10 mg total) by mouth at bedtime. 05/08/18   Georgina Quint, MD  tamsulosin (FLOMAX) 0.4 MG CAPS capsule Take 0.4 mg by mouth at bedtime.    [provider]    Allergies    Patient has no known allergies.  Review of Systems   Review of Systems  HENT: Positive for sore throat.   Respiratory: Positive for cough.   All other systems reviewed and are negative.   Physical Exam Updated Vital Signs BP (!) 145/84 (BP Location: Right Arm)   Pulse (!) 53   Temp 97.8 F (36.6 C) (Oral)   Resp 17   SpO2 100%   Physical Exam Vitals and nursing note reviewed.  Constitutional:      General: He is not in acute distress.    Appearance: Normal appearance. He is well-developed and well-nourished.  HENT:     Head: Normocephalic and atraumatic.     Right Ear: Hearing normal.     Left Ear: Hearing normal.     Nose: Nose normal.     Mouth/Throat:     Mouth: Oropharynx is clear and moist and mucous membranes are normal.  Eyes:  Extraocular Movements: EOM normal.     Conjunctiva/sclera: Conjunctivae normal.     Pupils: Pupils are equal, round, and reactive to light.  Cardiovascular:     Rate and Rhythm: Regular rhythm.     Heart sounds: S1 normal and S2 normal. No murmur heard. No friction rub. No gallop.   Pulmonary:     Effort: Pulmonary effort is normal. No respiratory distress.     Breath sounds: Normal breath sounds.  Chest:     Chest wall: No tenderness.  Abdominal:     General: Bowel sounds are normal.     Palpations: Abdomen is soft. There is no hepatosplenomegaly.     Tenderness: There is no abdominal tenderness. There is no guarding or rebound. Negative signs include Murphy's sign and McBurney's sign.     Hernia: No hernia is present.  Musculoskeletal:        General: Normal  range of motion.     Cervical back: Normal range of motion and neck supple.  Skin:    General: Skin is warm, dry and intact.     Findings: No rash.     Nails: There is no cyanosis.  Neurological:     Mental Status: He is alert and oriented to person, place, and time.     GCS: GCS eye subscore is 4. GCS verbal subscore is 5. GCS motor subscore is 6.     Cranial Nerves: No cranial nerve deficit.     Sensory: No sensory deficit.     Coordination: Coordination normal.     Deep Tendon Reflexes: Strength normal.  Psychiatric:        Mood and Affect: Mood and affect normal.        Speech: Speech normal.        Behavior: Behavior normal.        Thought Content: Thought content normal.     ED Results / Procedures / Treatments   Labs (all labs ordered are listed, but only abnormal results are displayed) Labs Reviewed  RESP PANEL BY RT-PCR (FLU A&B, COVID) ARPGX2 - Abnormal; Notable for the following components:      Result Value   SARS Coronavirus 2 by RT PCR POSITIVE (*)    All other components within normal limits    EKG None  Radiology No results found.  Procedures Procedures (including critical care time)  Medications Ordered in ED Medications - No data to display  ED Course  I have reviewed the triage vital signs and the nursing notes.  Pertinent labs & imaging results that were available during my care of the patient were reviewed by me and considered in my medical decision making (see chart for details).    MDM Rules/Calculators/A&P                          Covid is positive.  Patient is not hypoxic.  Exam is otherwise unremarkable.  As he is already vaccinated, likelihood is that he will have mild symptoms.  Given return precautions.  Final Clinical Impression(s) / ED Diagnoses Final diagnoses:  COVID-19    Rx / DC Orders ED Discharge Orders    None       , Gwenyth Allegra, MD 10/30/20 [phone]

## 2020-10-30 NOTE — ED Triage Notes (Signed)
Pt presents to ED POV. Pt c/o sore throat and cough began 10/29/2020. Reports daughter is positive for covid. Pt vaccinated for covid

## 2023-01-04 DIAGNOSIS — Z87448 Personal history of other diseases of urinary system: Secondary | ICD-10-CM | POA: Insufficient documentation

## 2023-01-04 DIAGNOSIS — Z8546 Personal history of malignant neoplasm of prostate: Secondary | ICD-10-CM | POA: Insufficient documentation

## 2023-06-25 ENCOUNTER — Other Ambulatory Visit: Payer: Self-pay

## 2023-06-25 ENCOUNTER — Emergency Department (HOSPITAL_BASED_OUTPATIENT_CLINIC_OR_DEPARTMENT_OTHER): Payer: BLUE CROSS/BLUE SHIELD

## 2023-06-25 ENCOUNTER — Inpatient Hospital Stay (HOSPITAL_COMMUNITY): Payer: BLUE CROSS/BLUE SHIELD

## 2023-06-25 ENCOUNTER — Inpatient Hospital Stay (HOSPITAL_BASED_OUTPATIENT_CLINIC_OR_DEPARTMENT_OTHER)
Admission: EM | Admit: 2023-06-25 | Discharge: 2023-06-27 | DRG: 395 | Disposition: A | Discharge status: Home / Self Care | Payer: BLUE CROSS/BLUE SHIELD | Attending: Internal Medicine | Admitting: Internal Medicine

## 2023-06-25 ENCOUNTER — Emergency Department (HOSPITAL_COMMUNITY): Payer: BLUE CROSS/BLUE SHIELD

## 2023-06-25 ENCOUNTER — Encounter (HOSPITAL_BASED_OUTPATIENT_CLINIC_OR_DEPARTMENT_OTHER): Payer: Self-pay | Admitting: *Deleted

## 2023-06-25 DIAGNOSIS — R739 Hyperglycemia, unspecified: Secondary | ICD-10-CM | POA: Diagnosis present

## 2023-06-25 DIAGNOSIS — Z1152 Encounter for screening for COVID-19: Secondary | ICD-10-CM

## 2023-06-25 DIAGNOSIS — K460 Unspecified abdominal hernia with obstruction, without gangrene: Secondary | ICD-10-CM | POA: Diagnosis not present

## 2023-06-25 DIAGNOSIS — K56609 Unspecified intestinal obstruction, unspecified as to partial versus complete obstruction: Secondary | ICD-10-CM | POA: Diagnosis present

## 2023-06-25 DIAGNOSIS — Z823 Family history of stroke: Secondary | ICD-10-CM | POA: Diagnosis not present

## 2023-06-25 DIAGNOSIS — C61 Malignant neoplasm of prostate: Secondary | ICD-10-CM | POA: Diagnosis present

## 2023-06-25 DIAGNOSIS — R1013 Epigastric pain: Secondary | ICD-10-CM | POA: Diagnosis present

## 2023-06-25 DIAGNOSIS — N400 Enlarged prostate without lower urinary tract symptoms: Secondary | ICD-10-CM | POA: Insufficient documentation

## 2023-06-25 DIAGNOSIS — K450 Other specified abdominal hernia with obstruction, without gangrene: Principal | ICD-10-CM

## 2023-06-25 DIAGNOSIS — Z9079 Acquired absence of other genital organ(s): Secondary | ICD-10-CM | POA: Diagnosis not present

## 2023-06-25 LAB — HEMOGLOBIN A1C
Hgb A1c MFr Bld: 5.3 % (ref 4.8–5.6)
Mean Plasma Glucose: 105.41 mg/dL

## 2023-06-25 LAB — CBC
HCT: 37.3 % — ABNORMAL LOW (ref 39.0–52.0)
Hemoglobin: 13.6 g/dL (ref 13.0–17.0)
MCH: 30.6 pg (ref 26.0–34.0)
MCHC: 36.5 g/dL — ABNORMAL HIGH (ref 30.0–36.0)
MCV: 84 fL (ref 80.0–100.0)
Platelets: 209 10*3/uL (ref 150–400)
RBC: 4.44 MIL/uL (ref 4.22–5.81)
RDW: 12.4 % (ref 11.5–15.5)
WBC: 6 10*3/uL (ref 4.0–10.5)
nRBC: 0 % (ref 0.0–0.2)

## 2023-06-25 LAB — URINALYSIS, ROUTINE W REFLEX MICROSCOPIC
Bacteria, UA: NONE SEEN
Bilirubin Urine: NEGATIVE
Glucose, UA: NEGATIVE mg/dL
Hgb urine dipstick: NEGATIVE
Ketones, ur: NEGATIVE mg/dL
Leukocytes,Ua: NEGATIVE
Nitrite: NEGATIVE
Protein, ur: 30 mg/dL — AB
Specific Gravity, Urine: 1.021 (ref 1.005–1.030)
pH: 7 (ref 5.0–8.0)

## 2023-06-25 LAB — COMPREHENSIVE METABOLIC PANEL
ALT: 16 U/L (ref 0–44)
AST: 15 U/L (ref 15–41)
Albumin: 4.4 g/dL (ref 3.5–5.0)
Alkaline Phosphatase: 78 U/L (ref 38–126)
Anion gap: 12 (ref 5–15)
BUN: 12 mg/dL (ref 6–20)
CO2: 25 mmol/L (ref 22–32)
Calcium: 9.1 mg/dL (ref 8.9–10.3)
Chloride: 102 mmol/L (ref 98–111)
Creatinine, Ser: 1.07 mg/dL (ref 0.61–1.24)
GFR, Estimated: >60 mL/min (ref >60)
Glucose, Bld: 145 mg/dL — ABNORMAL HIGH (ref 70–99)
Potassium: 3.5 mmol/L (ref 3.5–5.1)
Sodium: 139 mmol/L (ref 135–145)
Total Bilirubin: 0.6 mg/dL (ref 0.3–1.2)
Total Protein: 7.1 g/dL (ref 6.5–8.1)

## 2023-06-25 LAB — LACTIC ACID, PLASMA: Lactic Acid, Venous: 1.7 mmol/L (ref 0.5–1.9)

## 2023-06-25 LAB — HIV ANTIBODY (ROUTINE TESTING W REFLEX): HIV Screen 4th Generation wRfx: NONREACTIVE

## 2023-06-25 LAB — LIPASE, BLOOD: Lipase: 33 U/L (ref 11–51)

## 2023-06-25 MED ORDER — ONDANSETRON HCL 4 MG/2ML IJ SOLN: 4.0000 mg | Freq: Four times a day (QID) | INTRAMUSCULAR | Status: DC | PRN

## 2023-06-25 MED ORDER — HYDROMORPHONE HCL 1 MG/ML IJ SOLN
1.0000 mg | INTRAMUSCULAR | Status: DC | PRN
  Administered 2023-06-25: 1 mg via INTRAVENOUS
  Filled 2023-06-25 (×2): qty 1

## 2023-06-25 MED ORDER — IOHEXOL 300 MG/ML  SOLN
100.0000 mL | Freq: Once | INTRAMUSCULAR | Status: AC | PRN
  Administered 2023-06-25: 100 mL via INTRAVENOUS

## 2023-06-25 MED ORDER — ONDANSETRON HCL 4 MG/2ML IJ SOLN
4.0000 mg | Freq: Once | INTRAMUSCULAR | Status: AC
  Administered 2023-06-25: 4 mg via INTRAVENOUS
  Filled 2023-06-25: qty 2

## 2023-06-25 MED ORDER — SODIUM CHLORIDE 0.9 % IV BOLUS
1000.0000 mL | Freq: Once | INTRAVENOUS | Status: AC
  Administered 2023-06-25: 1000 mL via INTRAVENOUS

## 2023-06-25 MED ORDER — ONDANSETRON HCL 4 MG PO TABS: 4.0000 mg | ORAL_TABLET | Freq: Four times a day (QID) | ORAL | Status: DC | PRN

## 2023-06-25 MED ORDER — ORAL CARE MOUTH RINSE: 15.0000 mL | OROMUCOSAL | Status: DC | PRN

## 2023-06-25 MED ORDER — HYDROMORPHONE HCL 1 MG/ML IJ SOLN
1.0000 mg | Freq: Once | INTRAMUSCULAR | Status: AC
  Administered 2023-06-25: 1 mg via INTRAVENOUS
  Filled 2023-06-25: qty 1

## 2023-06-25 MED ORDER — HYDROMORPHONE HCL 1 MG/ML IJ SOLN
0.5000 mg | INTRAMUSCULAR | Status: DC | PRN
  Administered 2023-06-25 – 2023-06-26 (×4): 0.5 mg via INTRAVENOUS
  Filled 2023-06-25 (×4): qty 0.5

## 2023-06-25 MED ORDER — LACTATED RINGERS IV SOLN: INTRAVENOUS | Status: DC

## 2023-06-25 MED ORDER — PANTOPRAZOLE SODIUM 40 MG IV SOLR
40.0000 mg | INTRAVENOUS | Status: DC
  Administered 2023-06-25 – 2023-06-26 (×2): 40 mg via INTRAVENOUS
  Filled 2023-06-25 (×2): qty 10

## 2023-06-25 MED ORDER — PANTOPRAZOLE SODIUM 40 MG IV SOLR
40.0000 mg | Freq: Once | INTRAVENOUS | Status: AC
  Administered 2023-06-25: 40 mg via INTRAVENOUS
  Filled 2023-06-25: qty 10

## 2023-06-25 MED ORDER — DIATRIZOATE MEGLUMINE & SODIUM 66-10 % PO SOLN
90.0000 mL | Freq: Once | ORAL | Status: AC
  Administered 2023-06-25: 90 mL via NASOGASTRIC
  Filled 2023-06-25: qty 90

## 2023-06-25 MED ORDER — ENOXAPARIN SODIUM 40 MG/0.4ML IJ SOSY
40.0000 mg | PREFILLED_SYRINGE | INTRAMUSCULAR | Status: DC
  Administered 2023-06-25: 40 mg via SUBCUTANEOUS
  Filled 2023-06-25: qty 0.4

## 2023-06-25 MED ORDER — LIDOCAINE HCL URETHRAL/MUCOSAL 2 % EX GEL
1.0000 | Freq: Once | CUTANEOUS | Status: AC
  Administered 2023-06-25: 1
  Filled 2023-06-25: qty 11

## 2023-06-25 NOTE — ED Notes (Signed)
Returned from CT.

## 2023-06-25 NOTE — ED Provider Notes (Signed)
  Physical Exam  BP (!) 147/80   Pulse 68   Temp 98.6 F (37 C)   Resp 18   Ht 6\' 3"  (1.905 m)   Wt 117.9 kg   SpO2 100%   BMI 32.50 kg/m     Procedures  Procedures  ED Course / MDM    Medical Decision Making Amount and/or Complexity of Data Reviewed Labs: ordered. Radiology: ordered. ECG/medicine tests: ordered.  Risk Prescription drug management. Decision regarding hospitalization.   82M presenting with epigastric abd pain. Labs and imaging pending.  Labs: Urinalysis not evidence of UTI or hematuria, lipase normal, CBC without a leukocytosis or anemia, CMP unremarkable. Lactic acid pending.  CT Abdomen Pelvis: IMPRESSION:  1. Several loops of focally abnormal bowel are present in the right  lower quadrant and low anterior abdomen just above the bladder. The  loops are dilated and inflamed with submucosal edema and an  irregular shaggy appearance. There appears to be a focal transition  point. There is associated inflammatory stranding in the involved  mesentery as well as some reactive free fluid layering in the  pelvis. Findings are most suggestive of a focal bowel obstruction  and raise concern for internal hernia. Recommend surgical  consultation.    General surgery consulted: Spoke with Leary Roca, PA who agreed with plan for NGT placement and inpatient hospitalization. NGT orders placed. Additional Dilaudid administered PRN for pain control. The patient and his wife were updated regarding the plan of care. Hospitalist medicine was consulted for admission. Surgery recommended ER to ER for evaluation prior to admission, Dr. Adela Lank contacted and accepted the patient in ER to ER transfer.       Ernie Avena, MD 06/25/23 [phone]

## 2023-06-25 NOTE — ED Notes (Signed)
Kiana with [facility] called for ed to ed transport- dr Adela Lank accepting

## 2023-06-25 NOTE — ED Notes (Signed)
ED TO INPATIENT HANDOFF REPORT  ED Nurse Name and Phone #: [phone]  S Name/Age/Gender Pancho Picha 47 y.o. male Room/Bed: 025C/025C  Code Status   Code Status: Not on file  Home/SNF/Other Home Patient oriented to: self, place, time, and situation Is this baseline? Yes   Triage Complete: Triage complete  Chief Complaint SBO (small bowel obstruction) (HCC) [K56.609]  Triage Note Pt arrived via [facility]. C/o of general abd pain that started around 2am. C/o several episodes of vomiting. Describes pain as sharp and constant. Has not taken any medications pta. Denies any urinary problem.    Allergies No Known Allergies  Level of Care/Admitting Diagnosis ED Disposition     ED Disposition  Admit   Condition  --   Comment  Hospital [facility] [[id number]]  Level of Care: Med-Surg [16]  May admit patient to [facility] or [facility] if equivalent level of care is available:: No  Covid Evaluation: Asymptomatic - no recent exposure (last 10 days) testing not required  Diagnosis: SBO (small bowel obstruction) [facility]) [[id number]]  Admitting Physician: Almon Hercules [[id number]]  Attending Physician: Almon Hercules [id number]  Certification:: I certify this patient will need inpatient services for at least 2 midnights  Expected Medical Readiness: 06/27/2023          B Medical/Surgery History Past Medical History:  Diagnosis Date   Dysuria    Internal hemorrhoids    Peyronie's disease    Prostate cancer (HCC)    Urethral stricture    Past Surgical History:  Procedure Laterality Date   COLONOSCOPY WITH ESOPHAGOGASTRODUODENOSCOPY (EGD)  04-07-2015  propofol   PENILE BIOPSY N/A 07/20/2015   Procedure: PENILE PLICATION;  Surgeon: Malen Gauze, MD;  Location: [facility];  Service: Urology;  Laterality: N/A;   PROSTATE BIOPSY     REPAIR OF FRACTURED PENIS N/A 11/05/2016   Procedure: REPAIR OF FRACTURED PENIS;  Surgeon: Hildred Laser,  MD;  Location: [facility];  Service: Urology;  Laterality: N/A;     A IV Location/Drains/Wounds Patient Lines/Drains/Airways Status     Active Line/Drains/Airways     Name Placement date Placement time Site Days   Peripheral IV 06/25/23 20 G Left Antecubital 06/25/23  0641  Antecubital  less than 1   NG/OG Vented/Dual Lumen 18 Fr. Right nare 06/25/23  0900  Right nare  less than 1            Intake/Output Last 24 hours No intake or output data in the 24 hours ending 06/25/23 1439  Labs/Imaging Results for orders placed or performed during the hospital encounter of 06/25/23 (from the past 48 hour(s))  CBC     Status: Abnormal   Collection Time: 06/25/23  6:42 AM  Result Value Ref Range   WBC 6.0 4.0 - 10.5 K/uL   RBC 4.44 4.22 - 5.81 MIL/uL   Hemoglobin 13.6 13.0 - 17.0 g/dL   HCT 02.7 (L) 25.3 - 66.4 %   MCV 84.0 80.0 - 100.0 fL   MCH 30.6 26.0 - 34.0 pg   MCHC 36.5 (H) 30.0 - 36.0 g/dL   RDW 40.3 47.4 - 25.9 %   Platelets 209 150 - 400 K/uL   nRBC 0.0 0.0 - 0.2 %    Comment: Performed at [facility], [address] [place], [place] [address]  Comprehensive metabolic panel     Status: Abnormal   Collection Time: 06/25/23  6:42 AM  Result Value Ref Range  Sodium 139 135 - 145 mmol/L   Potassium 3.5 3.5 - 5.1 mmol/L   Chloride 102 98 - 111 mmol/L   CO2 25 22 - 32 mmol/L   Glucose, Bld 145 (H) 70 - 99 mg/dL    Comment: Glucose reference range applies only to samples taken after fasting for at least 8 hours.   BUN 12 6 - 20 mg/dL   Creatinine, Ser 1.61 0.61 - 1.24 mg/dL   Calcium 9.1 8.9 - 09.6 mg/dL   Total Protein 7.1 6.5 - 8.1 g/dL   Albumin 4.4 3.5 - 5.0 g/dL   AST 15 15 - 41 U/L   ALT 16 0 - 44 U/L   Alkaline Phosphatase 78 38 - 126 U/L   Total Bilirubin 0.6 0.3 - 1.2 mg/dL   GFR, Estimated >04 >54 mL/min    Comment: (NOTE) Calculated using the CKD-EPI Creatinine Equation (2021)    Anion gap 12 5 - 15    Comment: Performed at [facility], [address] [place], [place] [address]  Lipase, blood     Status: None   Collection Time: 06/25/23  6:42 AM  Result Value Ref Range   Lipase 33 11 - 51 U/L    Comment: Performed at [facility], [address] [place], [place] [address]  Urinalysis, Routine w reflex microscopic -Urine, Clean Catch     Status: Abnormal   Collection Time: 06/25/23  7:33 AM  Result Value Ref Range   Color, Urine YELLOW YELLOW   APPearance CLEAR CLEAR   Specific Gravity, Urine 1.021 1.005 - 1.030   pH 7.0 5.0 - 8.0   Glucose, UA NEGATIVE NEGATIVE mg/dL   Hgb urine dipstick NEGATIVE NEGATIVE   Bilirubin Urine NEGATIVE NEGATIVE   Ketones, ur NEGATIVE NEGATIVE mg/dL   Protein, ur 30 (A) NEGATIVE mg/dL   Nitrite NEGATIVE NEGATIVE   Leukocytes,Ua NEGATIVE NEGATIVE   RBC / HPF 0-5 0 - 5 RBC/hpf   WBC, UA 0-5 0 - 5 WBC/hpf   Bacteria, UA NONE SEEN NONE SEEN   Squamous Epithelial / HPF 0-5 0 - 5 /HPF   Mucus PRESENT     Comment: Performed at [facility], [address], [place], [place] [address]  Lactic acid, plasma     Status: None   Collection Time: 06/25/23  8:23 AM  Result Value Ref Range   Lactic Acid, Venous 1.7 0.5 - 1.9 mmol/L    Comment: Performed at [facility], [address] [place], [place] [address]   DG Abd Portable 1V-Small Bowel Protocol-Position Verification  Result Date: 06/25/2023 CLINICAL DATA:  Encounter for imaging study to confirm NG tube placement. EXAM: PORTABLE ABDOMEN - 1 VIEW COMPARISON:  One-view abdomen 06/25/2023 at 8:59 a.m. FINDINGS: The NG tube is coiled on itself in the fundus of the stomach. Bowel gas pattern is normal. Lung volumes are low. Lung bases are clear. IMPRESSION: NG tube is coiled on itself in the fundus of the stomach. Electronically Signed   By: Marin Roberts M.D.   On: 06/25/2023 11:29   DG Abdomen 1 View  Result Date: 06/25/2023 CLINICAL DATA:   47 year old male status post nasogastric tube placement. EXAM: ABDOMEN - 1 VIEW COMPARISON:  No priors. FINDINGS: Nasogastric tube noted with tip terminating in the mid body of the stomach. Gas and stool are seen scattered throughout the colon extending to the level of the distal rectum. No pathologic distension of small bowel is noted. No gross evidence of  pneumoperitoneum. IMPRESSION: 1. Tip of nasogastric tube is in the body of the stomach. Electronically Signed   By: Trudie Reed M.D.   On: 06/25/2023 09:36   CT ABDOMEN PELVIS W CONTRAST  Result Date: 06/25/2023 CLINICAL DATA:  Epigastric abdominal pain. History of prostate cancer. EXAM: CT ABDOMEN AND PELVIS WITH CONTRAST TECHNIQUE: Multidetector CT imaging of the abdomen and pelvis was performed using the standard protocol following bolus administration of intravenous contrast. RADIATION DOSE REDUCTION: This exam was performed according to the departmental dose-optimization program which includes automated exposure control, adjustment of the mA and/or kV according to patient size and/or use of iterative reconstruction technique. CONTRAST:  OMNIPAQUE IOHEXOL 300 MG/ML  SOLN COMPARISON:  Prior CT abdomen/pelvis 01/16/2015 FINDINGS: Lower chest: No acute abnormality. Hepatobiliary: No focal liver abnormality is seen. No gallstones, gallbladder wall thickening, or biliary dilatation. Pancreas: Unremarkable. No pancreatic ductal dilatation or surrounding inflammatory changes. Spleen: Normal in size without focal abnormality. Adrenals/Urinary Tract: Normal adrenal glands. No hydronephrosis, nephrolithiasis or enhancing renal mass. Circumscribed simple cyst in the interpolar right kidney again noted. Ureters and bladder are unremarkable. Stomach/Bowel: Several loops of abnormal small bowel present in the right lower quadrant and low anterior abdomen just above the bladder. There is submucosal edema and shagginess of the bowel border with inflammatory  changes in the associated mesentery. Findings suggest focal bowel obstruction, possible internal hernia. Vascular/Lymphatic: No significant vascular findings are present. No enlarged abdominal or pelvic lymph nodes. Reproductive: Prostate is unremarkable. Other: Trace free fluid in the pelvis is likely reactive. Musculoskeletal: No acute fracture or aggressive appearing lytic or blastic osseous lesion. IMPRESSION: 1. Several loops of focally abnormal bowel are present in the right lower quadrant and low anterior abdomen just above the bladder. The loops are dilated and inflamed with submucosal edema and an irregular shaggy appearance. There appears to be a focal transition point. There is associated inflammatory stranding in the involved mesentery as well as some reactive free fluid layering in the pelvis. Findings are most suggestive of a focal bowel obstruction and raise concern for internal hernia. Recommend surgical consultation. Electronically Signed   By: Malachy Moan M.D.   On: 06/25/2023 08:03    Pending Labs Unresulted Labs (From admission, onward)     Start     Ordered   06/26/23 0500  CBC  Tomorrow morning,   R        06/25/23 1033   06/26/23 0500  Basic metabolic panel  Tomorrow morning,   R        06/25/23 1033            Vitals/Pain Today's Vitals   06/25/23 1200 06/25/23 1205 06/25/23 1345 06/25/23 1349  BP: (!) 141/79  (!) 137/90   Pulse:  60 (!) 57 60  Resp:  11 11 17   Temp:      TempSrc:      SpO2:  99% 100% 100%  Weight:      Height:      PainSc:        Isolation Precautions No active isolations  Medications Medications  HYDROmorphone (DILAUDID) injection 1 mg (1 mg Intravenous Given 06/25/23 1438)  sodium chloride 0.9 % bolus 1,000 mL (1,000 mLs Intravenous New Bag/Given 06/25/23 0647)  ondansetron (ZOFRAN) injection 4 mg (4 mg Intravenous Given 06/25/23 0643)  HYDROmorphone (DILAUDID) injection 1 mg (1 mg Intravenous Given 06/25/23 0643)  pantoprazole  (PROTONIX) injection 40 mg (40 mg Intravenous Given 06/25/23 0647)  iohexol (OMNIPAQUE) 300 MG/ML  solution 100 mL (100 mLs Intravenous Contrast Given 06/25/23 0738)  lidocaine (XYLOCAINE) 2 % jelly 1 Application (1 Application Other Given 06/25/23 0844)  HYDROmorphone (DILAUDID) injection 1 mg (1 mg Intravenous Given 06/25/23 1108)  ondansetron (ZOFRAN) injection 4 mg (4 mg Intravenous Given 06/25/23 1108)  diatrizoate meglumine-sodium (GASTROGRAFIN) 66-10 % solution 90 mL (90 mLs Per NG tube Given 06/25/23 1139)    Mobility walks     Focused Assessments    R Recommendations: See Admitting Provider Note  Report given to:   Additional Notes:

## 2023-06-25 NOTE — H&P (Signed)
History and Physical    Patient: Dennis Newton ZOX:[id number] DOB: [birth year] DOA: 06/25/2023 DOS: the patient was seen and examined on 06/25/2023 PCP: Patient, No Pcp Per  Patient coming from: Home  Chief Complaint:  Chief Complaint  Patient presents with   Abdominal Pain   HPI: Dennis Newton is a 47 y.o. male with PMH of prostate cancer s/p prostatectomy presented to [facility] ED with acute abdominal pain, nausea and vomiting since last night.  Patient reports acute onset generalized abdominal pain about 2 AM last night.  Described the pain as cramping.  Pain is intermittent.  Rates severity a 10/10 before arrival to ED but improved to 7/10 after pain medication.  Has associated nausea and vomiting.  Reports 2 episodes of emesis at 5 and 5:15 AM this morning.  Last bowel movement about midnight.  Passed flatus about 4 AM this morning.  Denies fever, chills, runny nose, sore throat, chest pain, shortness of breath, cough, UTI or neurosymptoms.  Denies prior history of SBO.  No prior surgical history other than prostatectomy about 3 and half years ago.  Denies history of diabetes, hypertension, heart disease or kidney disease.  Denies smoking cigarette, drinking alcohol recreational drug use.  Likes to have cardiopulmonary substation and an event of sudden cardiopulmonary arrest.  In ED, vital stable.  CMP CBC and UA without significant finding other than mild hyperglycemia at 145.  CT abdomen and pelvis raise concern for SBO with possible internal hernia.  General surgery consulted and recommended ED to ED transfer.  Patient was evaluated by general surgery after arrival to [facility].  Surgery recommended conservative management with NGT for bowel decompression.  Hospitalist service called for admission. Review of Systems: As mentioned in the history of present illness. All other systems reviewed and are negative. Past Medical History:  Diagnosis Date   Dysuria    Internal hemorrhoids    Peyronie's  disease    Prostate cancer [facility])    Urethral stricture    Past Surgical History:  Procedure Laterality Date   COLONOSCOPY WITH ESOPHAGOGASTRODUODENOSCOPY (EGD)  04-07-2015  propofol   PENILE BIOPSY N/A 07/20/2015   Procedure: PENILE PLICATION;  Surgeon: Malen Gauze, MD;  Location: [facility];  Service: Urology;  Laterality: N/A;   PROSTATE BIOPSY     REPAIR OF FRACTURED PENIS N/A 11/05/2016   Procedure: REPAIR OF FRACTURED PENIS;  Surgeon: Hildred Laser, MD;  Location: [facility];  Service: Urology;  Laterality: N/A;   Social History:  reports that he has never smoked. He has never used smokeless tobacco. He reports that he does not drink alcohol and does not use drugs.  No Known Allergies  Family History  Problem Relation Age of Onset   Stroke Mother    Breast cancer Neg Hx    Prostate cancer Neg Hx    Colon cancer Neg Hx    Pancreatic cancer Neg Hx     Prior to Admission medications   Medication Sig Start Date End Date Taking? Authorizing Provider  triamcinolone cream (KENALOG) 0.1 % Apply 1 Application topically 2 (two) times daily.   Yes [provider]    Physical Exam: Vitals:   06/25/23 1205 06/25/23 1345 06/25/23 1349 06/25/23 1450  BP:  (!) 137/90    Pulse: 60 (!) 57 60   Resp: 11 11 17    Temp:    98.3 F (36.8 C)  TempSrc:    Oral  SpO2: 99% 100% 100%   Weight:  Height:       GENERAL: No apparent distress.  Nontoxic. HEENT: MMM.  Vision and hearing grossly intact.  NGT in place.  No output noted.  NECK: Supple.  No apparent JVD.  RESP:  No IWOB.  Fair aeration bilaterally. CVS:  RRR. Heart sounds normal.  ABD/GI/GU: BS+. Abd soft.  Mild diffuse tenderness.  No rebound. MSK/EXT:   No apparent deformity. Moves extremities. No edema.  SKIN: no apparent skin lesion or wound NEURO: Awake and alert. Oriented appropriately.  No apparent focal neuro deficit. PSYCH: Calm. Normal affect.   Data Reviewed: See HPI  Assessment  and Plan: Principal Problem:   SBO (small bowel obstruction) (HCC) Active Problems:   Adenocarcinoma of prostate (HCC)   Hyperglycemia   Small bowel obstruction: Presents with acute abdominal pain, nausea and vomiting.  CT abdomen and pelvis concerning for small bowel obstruction with possible internal hernia.  Exam with tenderness but no rebound or guarding.  Last bowel movement at midnight. -General Surgery managing-recommended conservative management with NGT decompression -NPO, IV fluid, IV Dilaudid and IV Zofran  Hyperglycemia: No history of diabetes. -Check hemoglobin A1c  History of prostate cancer s/p prostatectomy -Outpatient follow-up   Advance Care Planning:   Code Status: Full Code   Consults: General Surgery  Family Communication: Updated patient's son at bedside  Severity of Illness: The appropriate patient status for this patient is INPATIENT. Inpatient status is judged to be reasonable and necessary in order to provide the required intensity of service to ensure the patient's safety. The patient's presenting symptoms, physical exam findings, and initial radiographic and laboratory data in the context of their chronic comorbidities is felt to place them at high risk for further clinical deterioration. Furthermore, it is not anticipated that the patient will be medically stable for discharge from the hospital within 2 midnights of admission.   * I certify that at the point of admission it is my clinical judgment that the patient will require inpatient hospital care spanning beyond 2 midnights from the point of admission due to high intensity of service, high risk for further deterioration and high frequency of surveillance required.*  Author: Almon Hercules, MD 06/25/2023 3:37 PM  For on call review [email or web address].

## 2023-06-25 NOTE — ED Provider Notes (Signed)
[facility] EMERGENCY [facility] Emergency Department Provider Note MRN:  [id number]  Arrival date & time: 06/25/23     Chief Complaint   Abdominal Pain   History of Present Illness   Dennis Newton is a 47 y.o. year-old male with a history of prostate cancer presenting to the ED with chief complaint of abdominal pain.  Epigastric abdominal pain severe since 2 AM.  Associated with nausea vomiting.  No diarrhea, no fever.  Review of Systems  A thorough review of systems was obtained and all systems are negative except as noted in the HPI and PMH.   Patient's Health History    Past Medical History:  Diagnosis Date   Dysuria    Internal hemorrhoids    Peyronie's disease    Prostate cancer [facility])    Urethral stricture     Past Surgical History:  Procedure Laterality Date   COLONOSCOPY WITH ESOPHAGOGASTRODUODENOSCOPY (EGD)  04-07-2015  propofol   PENILE BIOPSY N/A 07/20/2015   Procedure: PENILE PLICATION;  Surgeon: Malen Gauze, MD;  Location: [facility];  Service: Urology;  Laterality: N/A;   PROSTATE BIOPSY     REPAIR OF FRACTURED PENIS N/A 11/05/2016   Procedure: REPAIR OF FRACTURED PENIS;  Surgeon: Hildred Laser, MD;  Location: [facility];  Service: Urology;  Laterality: N/A;    Family History  Problem Relation Age of Onset   Stroke Mother    Breast cancer Neg Hx    Prostate cancer Neg Hx    Colon cancer Neg Hx    Pancreatic cancer Neg Hx     Social History   Socioeconomic History   Marital status: Married    Spouse name: Engineering geologist   Number of children: 4   Years of education: 11th grade   Highest education level: Not on file  Occupational History   Occupation: driver    Comment: long distance truck driver  Tobacco Use   Smoking status: Never   Smokeless tobacco: Never  Vaping Use   Vaping status: Never Used  Substance and Sexual Activity   Alcohol use: No    Alcohol/week: 0.0 standard drinks of alcohol   Drug use: No    Sexual activity: Yes  Other Topics Concern   Not on file  Social History Narrative   From [place], in [place]. Came to the [place] in 2002.   Lives with his wife and their 4 children.   Social Determinants of Health   Financial Resource Strain: Not on file  Food Insecurity: Not on file  Transportation Needs: Not on file  Physical Activity: Not on file  Stress: Not on file  Social Connections: Not on file  Intimate Partner Violence: Not on file     Physical Exam   Vitals:   06/25/23 0630  BP: (!) 147/80  Pulse: 68  Resp: 18  Temp: 98.6 F (37 C)  SpO2: 100%    CONSTITUTIONAL: Well-appearing, moderate distress due to pain NEURO/PSYCH:  Alert and oriented x 3, no focal deficits EYES:  eyes equal and reactive ENT/NECK:  no LAD, no JVD CARDIO: Regular rate, well-perfused, normal S1 and S2 PULM:  CTAB no wheezing or rhonchi GI/GU:  non-distended, moderate epigastric tenderness to palpation MSK/SPINE:  No gross deformities, no edema SKIN:  no rash, atraumatic   *Additional and/or pertinent findings included in MDM below  Diagnostic and Interventional Summary    EKG Interpretation Date/Time:    Ventricular Rate:    PR Interval:    QRS  Duration:    QT Interval:    QTC Calculation:   R Axis:      Text Interpretation:         Labs Reviewed  CBC  COMPREHENSIVE METABOLIC PANEL  LIPASE, BLOOD  URINALYSIS, ROUTINE W REFLEX MICROSCOPIC    CT ABDOMEN PELVIS W CONTRAST    (Results Pending)    Medications  sodium chloride 0.9 % bolus 1,000 mL (1,000 mLs Intravenous New Bag/Given 06/25/23 0647)  ondansetron (ZOFRAN) injection 4 mg (4 mg Intravenous Given 06/25/23 0643)  HYDROmorphone (DILAUDID) injection 1 mg (1 mg Intravenous Given 06/25/23 0643)  pantoprazole (PROTONIX) injection 40 mg (40 mg Intravenous Given 06/25/23 1610)     Procedures  /  Critical Care Procedures  ED Course and Medical Decision Making  Initial Impression and Ddx Suspicion for pancreatitis  versus gastritis versus cholecystitis, perforated viscus.  Past medical/surgical history that increases complexity of ED encounter: Prostate cancer  Interpretation of Diagnostics Labs and CT pending  Patient Reassessment and Ultimate Disposition/Management     Signed out to oncoming provider at shift change.  Patient management required discussion with the following services or consulting groups:  None  Complexity of Problems Addressed Acute illness or injury that poses threat of life of bodily function  Additional Data Reviewed and Analyzed Further history obtained from: Further history from spouse/family member  Additional Factors Impacting ED Encounter Risk None  Elmer Sow. Pilar Plate, MD [facility] Emergency Medicine [facility] [email or web address]  Final Clinical Impressions(s) / ED Diagnoses     ICD-10-CM   1. Epigastric pain  R10.13       ED Discharge Orders     None        Discharge Instructions Discussed with and Provided to Patient:   Discharge Instructions   None      Sabas Sous, MD 06/25/23 [phone]

## 2023-06-25 NOTE — ED Triage Notes (Signed)
Pt arrived via [facility]. C/o of general abd pain that started around 2am. C/o several episodes of vomiting. Describes pain as sharp and constant. Has not taken any medications pta. Denies any urinary problem.

## 2023-06-25 NOTE — ED Provider Notes (Signed)
10:33 AM patient arrived in transfer, found to have a small bowel obstruction with some concern for an internal hernia.  Will let surgery know the patient has arrived for evaluation.  Having ongoing pain we will repeat pain dosing here.  General surgery seen, do not think would benefit from acute surgery.  Will discuss with medicine for admission.    Melene Plan, DO 06/25/23 1442

## 2023-06-25 NOTE — Consult Note (Signed)
Reason for Consult:ab pain Referring Physician: [name] is an 47 y.o. male.  HPI: 45 yom with prior prostatectomy for prostate cancer who presents with epigastric ab pain about 230 this am. He has had some flatus since then. He had n/v.  No fevers. Never had this before .nothing making better so went to outside ER.  There he was found to have nl wbc and nl lactic acid. CT shows some dilated, inflamed small bowel loops in pelvis (read as raise concern for internal hernia).  He was transferred to the er here and I am seeing him. He does state he feels better with ng now  Past Medical History:  Diagnosis Date   Dysuria    Internal hemorrhoids    Peyronie's disease    Prostate cancer (HCC)    Urethral stricture     Past Surgical History:  Procedure Laterality Date   COLONOSCOPY WITH ESOPHAGOGASTRODUODENOSCOPY (EGD)  04-07-2015  propofol   PENILE BIOPSY N/A 07/20/2015   Procedure: PENILE PLICATION;  Surgeon: Malen Gauze, MD;  Location: [facility];  Service: Urology;  Laterality: N/A;   PROSTATE BIOPSY     REPAIR OF FRACTURED PENIS N/A 11/05/2016   Procedure: REPAIR OF FRACTURED PENIS;  Surgeon: Hildred Laser, MD;  Location: [facility];  Service: Urology;  Laterality: N/A;    Family History  Problem Relation Age of Onset   Stroke Mother    Breast cancer Neg Hx    Prostate cancer Neg Hx    Colon cancer Neg Hx    Pancreatic cancer Neg Hx     Social History:  reports that he has never smoked. He has never used smokeless tobacco. He reports that he does not drink alcohol and does not use drugs.  Allergies: No Known Allergies  No meds prior to admission  Results for orders placed or performed during the hospital encounter of 06/25/23 (from the past 48 hour(s))  CBC     Status: Abnormal   Collection Time: 06/25/23  6:42 AM  Result Value Ref Range   WBC 6.0 4.0 - 10.5 K/uL   RBC 4.44 4.22 - 5.81 MIL/uL   Hemoglobin 13.6 13.0 - 17.0 g/dL    HCT 82.9 (L) 56.2 - 52.0 %   MCV 84.0 80.0 - 100.0 fL   MCH 30.6 26.0 - 34.0 pg   MCHC 36.5 (H) 30.0 - 36.0 g/dL   RDW 13.0 86.5 - 78.4 %   Platelets 209 150 - 400 K/uL   nRBC 0.0 0.0 - 0.2 %    Comment: Performed at [facility], [address] [place], [place] [address]  Comprehensive metabolic panel     Status: Abnormal   Collection Time: 06/25/23  6:42 AM  Result Value Ref Range   Sodium 139 135 - 145 mmol/L   Potassium 3.5 3.5 - 5.1 mmol/L   Chloride 102 98 - 111 mmol/L   CO2 25 22 - 32 mmol/L   Glucose, Bld 145 (H) 70 - 99 mg/dL    Comment: Glucose reference range applies only to samples taken after fasting for at least 8 hours.   BUN 12 6 - 20 mg/dL   Creatinine, Ser 5.28 0.61 - 1.24 mg/dL   Calcium 9.1 8.9 - 41.3 mg/dL   Total Protein 7.1 6.5 - 8.1 g/dL   Albumin 4.4 3.5 - 5.0 g/dL   AST 15 15 - 41 U/L   ALT 16 0 - 44 U/L   Alkaline  Phosphatase 78 38 - 126 U/L   Total Bilirubin 0.6 0.3 - 1.2 mg/dL   GFR, Estimated >21 >30 mL/min    Comment: (NOTE) Calculated using the CKD-EPI Creatinine Equation (2021)    Anion gap 12 5 - 15    Comment: Performed at [facility], [address], [place], [place] [address]  Lipase, blood     Status: None   Collection Time: 06/25/23  6:42 AM  Result Value Ref Range   Lipase 33 11 - 51 U/L    Comment: Performed at [facility], [address] [place], [place] [address]  Urinalysis, Routine w reflex microscopic -Urine, Clean Catch     Status: Abnormal   Collection Time: 06/25/23  7:33 AM  Result Value Ref Range   Color, Urine YELLOW YELLOW   APPearance CLEAR CLEAR   Specific Gravity, Urine 1.021 1.005 - 1.030   pH 7.0 5.0 - 8.0   Glucose, UA NEGATIVE NEGATIVE mg/dL   Hgb urine dipstick NEGATIVE NEGATIVE   Bilirubin Urine NEGATIVE NEGATIVE   Ketones, ur NEGATIVE NEGATIVE mg/dL   Protein, ur 30 (A) NEGATIVE mg/dL   Nitrite NEGATIVE NEGATIVE   Leukocytes,Ua NEGATIVE  NEGATIVE   RBC / HPF 0-5 0 - 5 RBC/hpf   WBC, UA 0-5 0 - 5 WBC/hpf   Bacteria, UA NONE SEEN NONE SEEN   Squamous Epithelial / HPF 0-5 0 - 5 /HPF   Mucus PRESENT     Comment: Performed at [facility], [address] [place], [place] [address]  Lactic acid, plasma     Status: None   Collection Time: 06/25/23  8:23 AM  Result Value Ref Range   Lactic Acid, Venous 1.7 0.5 - 1.9 mmol/L    Comment: Performed at [facility], [address] [place], [place] [address]    DG Abdomen 1 View  Result Date: 06/25/2023 CLINICAL DATA:  47 year old male status post nasogastric tube placement. EXAM: ABDOMEN - 1 VIEW COMPARISON:  No priors. FINDINGS: Nasogastric tube noted with tip terminating in the mid body of the stomach. Gas and stool are seen scattered throughout the colon extending to the level of the distal rectum. No pathologic distension of small bowel is noted. No gross evidence of pneumoperitoneum. IMPRESSION: 1. Tip of nasogastric tube is in the body of the stomach. Electronically Signed   By: Trudie Reed M.D.   On: 06/25/2023 09:36   CT ABDOMEN PELVIS W CONTRAST  Result Date: 06/25/2023 CLINICAL DATA:  Epigastric abdominal pain. History of prostate cancer. EXAM: CT ABDOMEN AND PELVIS WITH CONTRAST TECHNIQUE: Multidetector CT imaging of the abdomen and pelvis was performed using the standard protocol following bolus administration of intravenous contrast. RADIATION DOSE REDUCTION: This exam was performed according to the departmental dose-optimization program which includes automated exposure control, adjustment of the mA and/or kV according to patient size and/or use of iterative reconstruction technique. CONTRAST:  OMNIPAQUE IOHEXOL 300 MG/ML  SOLN COMPARISON:  Prior CT abdomen/pelvis 01/16/2015 FINDINGS: Lower chest: No acute abnormality. Hepatobiliary: No focal liver abnormality is seen. No gallstones, gallbladder wall thickening, or biliary  dilatation. Pancreas: Unremarkable. No pancreatic ductal dilatation or surrounding inflammatory changes. Spleen: Normal in size without focal abnormality. Adrenals/Urinary Tract: Normal adrenal glands. No hydronephrosis, nephrolithiasis or enhancing renal mass. Circumscribed simple cyst in the interpolar right kidney again noted. Ureters and bladder are unremarkable. Stomach/Bowel: Several loops of abnormal small bowel present in the right lower quadrant and low anterior abdomen just above the bladder.  There is submucosal edema and shagginess of the bowel border with inflammatory changes in the associated mesentery. Findings suggest focal bowel obstruction, possible internal hernia. Vascular/Lymphatic: No significant vascular findings are present. No enlarged abdominal or pelvic lymph nodes. Reproductive: Prostate is unremarkable. Other: Trace free fluid in the pelvis is likely reactive. Musculoskeletal: No acute fracture or aggressive appearing lytic or blastic osseous lesion. IMPRESSION: 1. Several loops of focally abnormal bowel are present in the right lower quadrant and low anterior abdomen just above the bladder. The loops are dilated and inflamed with submucosal edema and an irregular shaggy appearance. There appears to be a focal transition point. There is associated inflammatory stranding in the involved mesentery as well as some reactive free fluid layering in the pelvis. Findings are most suggestive of a focal bowel obstruction and raise concern for internal hernia. Recommend surgical consultation. Electronically Signed   By: Malachy Moan M.D.   On: 06/25/2023 08:03    Review of Systems  Constitutional:  Negative for fever.  Gastrointestinal:  Positive for abdominal pain, nausea and vomiting.  All other systems reviewed and are negative.  Blood pressure 135/86, pulse 89, temperature 98.6 F (37 C), resp. rate 17, height 6\' 3"  (1.905 m), weight 117.9 kg, SpO2 99%. Physical Exam Vitals  reviewed.  Constitutional:      Appearance: He is well-developed.  Eyes:     General: No scleral icterus. Cardiovascular:     Rate and Rhythm: Normal rate and regular rhythm.  Pulmonary:     Effort: Pulmonary effort is normal.     Breath sounds: No wheezing.  Abdominal:     General: There is distension (mild).     Tenderness: There is no abdominal tenderness.     Hernia: No hernia is present.  Skin:    General: Skin is warm and dry.     Capillary Refill: Capillary refill takes less than 2 seconds.  Neurological:     General: No focal deficit present.     Mental Status: He is alert.  Psychiatric:        Mood and Affect: Mood normal.        Behavior: Behavior normal.     Assessment/Plan: SBO -ct reviewed and see concern but clinical picture is not toxic, exam, labs etc dont merit surgery right now -will do ng and small bowel protocol -discussed surgery for worsening or nonprogression -would do lovenox for prophylaxis  Dennis Newton 06/25/2023, 10:33 AM

## 2023-06-26 ENCOUNTER — Inpatient Hospital Stay (HOSPITAL_COMMUNITY): Payer: BLUE CROSS/BLUE SHIELD

## 2023-06-26 DIAGNOSIS — K56609 Unspecified intestinal obstruction, unspecified as to partial versus complete obstruction: Secondary | ICD-10-CM | POA: Diagnosis not present

## 2023-06-26 DIAGNOSIS — R739 Hyperglycemia, unspecified: Secondary | ICD-10-CM | POA: Diagnosis not present

## 2023-06-26 LAB — COMPREHENSIVE METABOLIC PANEL
ALT: 20 U/L (ref 0–44)
AST: 17 U/L (ref 15–41)
Albumin: 3.9 g/dL (ref 3.5–5.0)
Alkaline Phosphatase: 84 U/L (ref 38–126)
Anion gap: 11 (ref 5–15)
BUN: 8 mg/dL (ref 6–20)
CO2: 26 mmol/L (ref 22–32)
Calcium: 8.9 mg/dL (ref 8.9–10.3)
Chloride: 100 mmol/L (ref 98–111)
Creatinine, Ser: 1.03 mg/dL (ref 0.61–1.24)
GFR, Estimated: >60 mL/min (ref >60)
Glucose, Bld: 100 mg/dL — ABNORMAL HIGH (ref 70–99)
Potassium: 3.5 mmol/L (ref 3.5–5.1)
Sodium: 137 mmol/L (ref 135–145)
Total Bilirubin: 1.3 mg/dL — ABNORMAL HIGH (ref 0.3–1.2)
Total Protein: 7 g/dL (ref 6.5–8.1)

## 2023-06-26 LAB — CBC
HCT: 38.1 % — ABNORMAL LOW (ref 39.0–52.0)
Hemoglobin: 13.3 g/dL (ref 13.0–17.0)
MCH: 29.4 pg (ref 26.0–34.0)
MCHC: 34.9 g/dL (ref 30.0–36.0)
MCV: 84.1 fL (ref 80.0–100.0)
Platelets: 216 10*3/uL (ref 150–400)
RBC: 4.53 MIL/uL (ref 4.22–5.81)
RDW: 12.4 % (ref 11.5–15.5)
WBC: 8.2 10*3/uL (ref 4.0–10.5)
nRBC: 0 % (ref 0.0–0.2)

## 2023-06-26 NOTE — Progress Notes (Signed)
NGT removed per order. Pt tolerated well.

## 2023-06-26 NOTE — Progress Notes (Signed)
Ordered pt clear liquid dinner tray

## 2023-06-26 NOTE — Progress Notes (Signed)
[facility]                                                                               Jeffre Windecker, is a 47 y.o. male, DOB - [birth year], [id number] Admit date - 06/25/2023    Outpatient Primary MD for the patient is Patient, No Pcp Per  LOS - 1  days    Brief summary    47 y.o. male with PMH of prostate cancer s/p prostatectomy presented to [facility] ED with acute abdominal pain, nausea and vomiting since last night.     Assessment & Plan    Assessment and Plan:   Small bowel obstruction: Presents with acute abdominal pain, nausea and vomiting.  CT abdomen and pelvis concerning for small bowel obstruction with possible internal hernia.   General surgery consulted and recommended conservative management .  Abd x ray shows Enteric contrast seen within nondilated colon.  Clamping trials today , if able to tolerate NG tube will be removed.  Clear liquids for today.     Hyperglycemia:  A1c is 5.3%    History of prostate cancer s/p prostatectomy -Outpatient follow-up    Estimated body mass index is 32.5 kg/m as calculated from the following:   Height as of this encounter: 6\' 3"  (1.905 m).   Weight as of this encounter: 117.9 kg.  Code Status: full code.  DVT Prophylaxis:  enoxaparin (LOVENOX) injection 40 mg Start: 06/25/23 1600   Level of Care: Level of care: Med-Surg Family Communication: Updated patient's   Procedures:  none  Consultants:   Gen surgery  Antimicrobials:   Anti-infectives (From admission, onward)    None        Medications  Scheduled Meds:  enoxaparin (LOVENOX) injection  40 mg Subcutaneous Q24H   pantoprazole (PROTONIX) IV  40 mg Intravenous Q24H   Continuous Infusions:  lactated ringers 125 mL/hr at 06/25/23 2348   PRN Meds:.HYDROmorphone (DILAUDID) injection, ondansetron **OR** ondansetron (ZOFRAN) IV, mouth rinse    Subjective:   Dennis Newton was seen and examined today. Passing flatus , no  BM  Objective:   Vitals:   06/25/23 1908 06/26/23 0029 06/26/23 0523 06/26/23 1241  BP: (!) 150/88 (!) 144/81 (!) 160/93 (!) 146/90  Pulse: (!) 54 68 (!) 57 (!) 58  Resp: 19 16 19 18   Temp: 98.6 F (37 C) 99.4 F (37.4 C) 98.7 F (37.1 C) 99.1 F (37.3 C)  TempSrc: Oral Oral Oral Oral  SpO2: 99% 96% 97% 96%  Weight:      Height:        Intake/Output Summary (Last 24 hours) at 06/26/2023 1732 Last data filed at 06/26/2023 0523 Gross per 24 hour  Intake 60 ml  Output 1150 ml  Net -1090 ml   Filed Weights   06/25/23 0627  Weight: 117.9 kg     Exam General: Alert and oriented x 3, NAD Cardiovascular: S1 S2 auscultated, no murmurs, RRR Respiratory: Clear to auscultation bilaterally, no wheezing, rales or rhonchi Gastrointestinal: Soft, nontender, nondistended, + bowel sounds Ext: no pedal edema bilaterally Neuro: AAOx3, Cr N's II- XII. Strength 5/5 upper and lower extremities bilaterally Skin: No rashes Psych: Normal affect and  demeanor, alert and oriented x3    Data Reviewed:  I have personally reviewed following labs and imaging studies   CBC Lab Results  Component Value Date   WBC 8.2 06/26/2023   RBC 4.53 06/26/2023   HGB 13.3 06/26/2023   HCT 38.1 (L) 06/26/2023   MCV 84.1 06/26/2023   MCH 29.4 06/26/2023   PLT 216 06/26/2023   MCHC 34.9 06/26/2023   RDW 12.4 06/26/2023   LYMPHSABS 2.3 11/17/2017   EOSABS 0.5 (H) 11/17/2017   BASOSABS 0.0 11/17/2017     Last metabolic panel Lab Results  Component Value Date   NA 137 06/26/2023   K 3.5 06/26/2023   CL 100 06/26/2023   CO2 26 06/26/2023   BUN 8 06/26/2023   CREATININE 1.03 06/26/2023   GLUCOSE 100 (H) 06/26/2023   GFRNONAA >60 06/26/2023   GFRAA 89 11/17/2017   CALCIUM 8.9 06/26/2023   PROT 7.0 06/26/2023   ALBUMIN 3.9 06/26/2023   LABGLOB 2.9 11/17/2017   AGRATIO 1.6 11/17/2017   BILITOT 1.3 (H) 06/26/2023   ALKPHOS 84 06/26/2023   AST 17 06/26/2023   ALT 20 06/26/2023   ANIONGAP 11  06/26/2023    CBG (last 3)  No results for input(s): "GLUCAP" in the last 72 hours.    Coagulation Profile: No results for input(s): "INR", "PROTIME" in the last 168 hours.   Radiology Studies: DG Abd Portable 1V  Result Date: 06/26/2023 CLINICAL DATA:  Nasogastric tube EXAM: PORTABLE ABDOMEN - 1 VIEW COMPARISON:  Yesterday FINDINGS: Enteric tube with tip and side port at the stomach. Contrast is seen within nondilated colon. No dilated bowel seen over the upper abdomen. Clear lung bases IMPRESSION: 1. Located enteric tube with tip and side port at the stomach. 2. Enteric contrast seen within nondilated colon. Electronically Signed   By: Tiburcio Pea M.D.   On: 06/26/2023 05:15   DG Abd Portable 1V-Small Bowel Obstruction Protocol-initial, 8 hr delay  Result Date: 06/25/2023 CLINICAL DATA:  Follow up small bowel obstruction EXAM: PORTABLE ABDOMEN - 1 VIEW COMPARISON:  Film from earlier in the same day. FINDINGS: Gastric catheter is noted within the distal stomach. Administered contrast lies within the small bowel as well as within the colon consistent with a partial small bowel obstruction. No free air is seen. No other focal abnormality is noted. IMPRESSION: Administered contrast has passed into the colon consistent with a partial small bowel obstruction. The degree of dilatation has improved when compared with the prior exam. Electronically Signed   By: Alcide Clever M.D.   On: 06/25/2023 21:30   DG Abd Portable 1V-Small Bowel Protocol-Position Verification  Result Date: 06/25/2023 CLINICAL DATA:  Encounter for imaging study to confirm NG tube placement. EXAM: PORTABLE ABDOMEN - 1 VIEW COMPARISON:  One-view abdomen 06/25/2023 at 8:59 a.m. FINDINGS: The NG tube is coiled on itself in the fundus of the stomach. Bowel gas pattern is normal. Lung volumes are low. Lung bases are clear. IMPRESSION: NG tube is coiled on itself in the fundus of the stomach. Electronically Signed   By: Marin Roberts M.D.   On: 06/25/2023 11:29   DG Abdomen 1 View  Result Date: 06/25/2023 CLINICAL DATA:  47 year old male status post nasogastric tube placement. EXAM: ABDOMEN - 1 VIEW COMPARISON:  No priors. FINDINGS: Nasogastric tube noted with tip terminating in the mid body of the stomach. Gas and stool are seen scattered throughout the colon extending to the level of the distal rectum. No pathologic distension  of small bowel is noted. No gross evidence of pneumoperitoneum. IMPRESSION: 1. Tip of nasogastric tube is in the body of the stomach. Electronically Signed   By: Trudie Reed M.D.   On: 06/25/2023 09:36   CT ABDOMEN PELVIS W CONTRAST  Result Date: 06/25/2023 CLINICAL DATA:  Epigastric abdominal pain. History of prostate cancer. EXAM: CT ABDOMEN AND PELVIS WITH CONTRAST TECHNIQUE: Multidetector CT imaging of the abdomen and pelvis was performed using the standard protocol following bolus administration of intravenous contrast. RADIATION DOSE REDUCTION: This exam was performed according to the departmental dose-optimization program which includes automated exposure control, adjustment of the mA and/or kV according to patient size and/or use of iterative reconstruction technique. CONTRAST:  OMNIPAQUE IOHEXOL 300 MG/ML  SOLN COMPARISON:  Prior CT abdomen/pelvis 01/16/2015 FINDINGS: Lower chest: No acute abnormality. Hepatobiliary: No focal liver abnormality is seen. No gallstones, gallbladder wall thickening, or biliary dilatation. Pancreas: Unremarkable. No pancreatic ductal dilatation or surrounding inflammatory changes. Spleen: Normal in size without focal abnormality. Adrenals/Urinary Tract: Normal adrenal glands. No hydronephrosis, nephrolithiasis or enhancing renal mass. Circumscribed simple cyst in the interpolar right kidney again noted. Ureters and bladder are unremarkable. Stomach/Bowel: Several loops of abnormal small bowel present in the right lower quadrant and low anterior abdomen just  above the bladder. There is submucosal edema and shagginess of the bowel border with inflammatory changes in the associated mesentery. Findings suggest focal bowel obstruction, possible internal hernia. Vascular/Lymphatic: No significant vascular findings are present. No enlarged abdominal or pelvic lymph nodes. Reproductive: Prostate is unremarkable. Other: Trace free fluid in the pelvis is likely reactive. Musculoskeletal: No acute fracture or aggressive appearing lytic or blastic osseous lesion. IMPRESSION: 1. Several loops of focally abnormal bowel are present in the right lower quadrant and low anterior abdomen just above the bladder. The loops are dilated and inflamed with submucosal edema and an irregular shaggy appearance. There appears to be a focal transition point. There is associated inflammatory stranding in the involved mesentery as well as some reactive free fluid layering in the pelvis. Findings are most suggestive of a focal bowel obstruction and raise concern for internal hernia. Recommend surgical consultation. Electronically Signed   By: Malachy Moan M.D.   On: 06/25/2023 08:03       Kathlen Mody M.D. [facility] 06/26/2023, 5:32 PM  Available via Epic secure chat 7am-7pm After 7 pm, please refer to night coverage provider listed on amion.

## 2023-06-26 NOTE — Progress Notes (Signed)
NGT clamped per order. Will continue to monitor pt for any abdominal pain or N/V

## 2023-06-26 NOTE — Progress Notes (Signed)
Patient ID: Dennis Newton, male   DOB: 11-15-1975, 47 y.o.   MRN: [id number] [facility] Surgery Progress Note     Subjective: CC-  Feeling better. States that his abdominal pain is much less. He is passing a lot of flatus, no BM. Xray this AM with contrast in colon.  Objective: Vital signs in last 24 hours: Temp:  [98.3 F (36.8 C)-99.4 F (37.4 C)] 98.7 F (37.1 C) (08/26 0523) Pulse Rate:  [54-68] 57 (08/26 0523) Resp:  [11-19] 19 (08/26 0523) BP: (137-160)/(79-93) 160/93 (08/26 0523) SpO2:  [96 %-100 %] 97 % (08/26 0523) Last BM Date : 06/24/23  Intake/Output from previous day: 08/25 0701 - 08/26 0700 In: 60 [NG/GT:60] Out: 1150 [Emesis/NG output:1150] Intake/Output this shift: No intake/output data recorded.  PE: Gen:  Alert, NAD, pleasant Abd: soft, mild distension, nontender  Lab Results:  Recent Labs    06/25/23 0642 06/26/23 0341  WBC 6.0 8.2  HGB 13.6 13.3  HCT 37.3* 38.1*  PLT 209 216   BMET Recent Labs    06/25/23 0642 06/26/23 0341  NA 139 137  K 3.5 3.5  CL 102 100  CO2 25 26  GLUCOSE 145* 100*  BUN 12 8  CREATININE 1.07 1.03  CALCIUM 9.1 8.9   PT/INR No results for input(s): "LABPROT", "INR" in the last 72 hours. CMP     Component Value Date/Time   NA 137 06/26/2023 0341   NA 142 11/17/2017 1019   K 3.5 06/26/2023 0341   CL 100 06/26/2023 0341   CO2 26 06/26/2023 0341   GLUCOSE 100 (H) 06/26/2023 0341   BUN 8 06/26/2023 0341   BUN 12 11/17/2017 1019   CREATININE 1.03 06/26/2023 0341   CREATININE 0.95 01/17/2015 0848   CALCIUM 8.9 06/26/2023 0341   PROT 7.0 06/26/2023 0341   PROT 7.5 11/17/2017 1019   ALBUMIN 3.9 06/26/2023 0341   ALBUMIN 4.6 11/17/2017 1019   AST 17 06/26/2023 0341   ALT 20 06/26/2023 0341   ALKPHOS 84 06/26/2023 0341   BILITOT 1.3 (H) 06/26/2023 0341   BILITOT 0.3 11/17/2017 1019   GFRNONAA >60 06/26/2023 0341   GFRAA 89 11/17/2017 1019   Lipase     Component Value Date/Time   LIPASE 33  06/25/2023 0642       Studies/Results: DG Abd Portable 1V  Result Date: 06/26/2023 CLINICAL DATA:  Nasogastric tube EXAM: PORTABLE ABDOMEN - 1 VIEW COMPARISON:  Yesterday FINDINGS: Enteric tube with tip and side port at the stomach. Contrast is seen within nondilated colon. No dilated bowel seen over the upper abdomen. Clear lung bases IMPRESSION: 1. Located enteric tube with tip and side port at the stomach. 2. Enteric contrast seen within nondilated colon. Electronically Signed   By: Tiburcio Pea M.D.   On: 06/26/2023 05:15   DG Abd Portable 1V-Small Bowel Obstruction Protocol-initial, 8 hr delay  Result Date: 06/25/2023 CLINICAL DATA:  Follow up small bowel obstruction EXAM: PORTABLE ABDOMEN - 1 VIEW COMPARISON:  Film from earlier in the same day. FINDINGS: Gastric catheter is noted within the distal stomach. Administered contrast lies within the small bowel as well as within the colon consistent with a partial small bowel obstruction. No free air is seen. No other focal abnormality is noted. IMPRESSION: Administered contrast has passed into the colon consistent with a partial small bowel obstruction. The degree of dilatation has improved when compared with the prior exam. Electronically Signed   By: Alcide Clever M.D.   On: 06/25/2023  21:30   DG Abd Portable 1V-Small Bowel Protocol-Position Verification  Result Date: 06/25/2023 CLINICAL DATA:  Encounter for imaging study to confirm NG tube placement. EXAM: PORTABLE ABDOMEN - 1 VIEW COMPARISON:  One-view abdomen 06/25/2023 at 8:59 a.m. FINDINGS: The NG tube is coiled on itself in the fundus of the stomach. Bowel gas pattern is normal. Lung volumes are low. Lung bases are clear. IMPRESSION: NG tube is coiled on itself in the fundus of the stomach. Electronically Signed   By: Marin Roberts M.D.   On: 06/25/2023 11:29   DG Abdomen 1 View  Result Date: 06/25/2023 CLINICAL DATA:  47 year old male status post nasogastric tube placement.  EXAM: ABDOMEN - 1 VIEW COMPARISON:  No priors. FINDINGS: Nasogastric tube noted with tip terminating in the mid body of the stomach. Gas and stool are seen scattered throughout the colon extending to the level of the distal rectum. No pathologic distension of small bowel is noted. No gross evidence of pneumoperitoneum. IMPRESSION: 1. Tip of nasogastric tube is in the body of the stomach. Electronically Signed   By: Trudie Reed M.D.   On: 06/25/2023 09:36   CT ABDOMEN PELVIS W CONTRAST  Result Date: 06/25/2023 CLINICAL DATA:  Epigastric abdominal pain. History of prostate cancer. EXAM: CT ABDOMEN AND PELVIS WITH CONTRAST TECHNIQUE: Multidetector CT imaging of the abdomen and pelvis was performed using the standard protocol following bolus administration of intravenous contrast. RADIATION DOSE REDUCTION: This exam was performed according to the departmental dose-optimization program which includes automated exposure control, adjustment of the mA and/or kV according to patient size and/or use of iterative reconstruction technique. CONTRAST:  OMNIPAQUE IOHEXOL 300 MG/ML  SOLN COMPARISON:  Prior CT abdomen/pelvis 01/16/2015 FINDINGS: Lower chest: No acute abnormality. Hepatobiliary: No focal liver abnormality is seen. No gallstones, gallbladder wall thickening, or biliary dilatation. Pancreas: Unremarkable. No pancreatic ductal dilatation or surrounding inflammatory changes. Spleen: Normal in size without focal abnormality. Adrenals/Urinary Tract: Normal adrenal glands. No hydronephrosis, nephrolithiasis or enhancing renal mass. Circumscribed simple cyst in the interpolar right kidney again noted. Ureters and bladder are unremarkable. Stomach/Bowel: Several loops of abnormal small bowel present in the right lower quadrant and low anterior abdomen just above the bladder. There is submucosal edema and shagginess of the bowel border with inflammatory changes in the associated mesentery. Findings suggest focal  bowel obstruction, possible internal hernia. Vascular/Lymphatic: No significant vascular findings are present. No enlarged abdominal or pelvic lymph nodes. Reproductive: Prostate is unremarkable. Other: Trace free fluid in the pelvis is likely reactive. Musculoskeletal: No acute fracture or aggressive appearing lytic or blastic osseous lesion. IMPRESSION: 1. Several loops of focally abnormal bowel are present in the right lower quadrant and low anterior abdomen just above the bladder. The loops are dilated and inflamed with submucosal edema and an irregular shaggy appearance. There appears to be a focal transition point. There is associated inflammatory stranding in the involved mesentery as well as some reactive free fluid layering in the pelvis. Findings are most suggestive of a focal bowel obstruction and raise concern for internal hernia. Recommend surgical consultation. Electronically Signed   By: Malachy Moan M.D.   On: 06/25/2023 08:03    Anti-infectives: Anti-infectives (From admission, onward)    None        Assessment/Plan SBO - Xray today with contrast in colon and patient is passing flatus. Clamp NG and give clear liquids. If tolerating may remove NG later today.  ID - none FEN - IVF, clamp NG, CLD VTE -  SCDs, lovenox Foley - none   I reviewed last 24 h vitals and pain scores, last 48 h intake and output, last 24 h labs and trends, and last 24 h imaging results.    LOS: 1 day    Franne Forts, [facility] Surgery 06/26/2023, 11:32 AM Please see Amion for pager number during day hours 7:00am-4:30pm

## 2023-06-27 DIAGNOSIS — K56609 Unspecified intestinal obstruction, unspecified as to partial versus complete obstruction: Secondary | ICD-10-CM | POA: Diagnosis not present

## 2023-06-27 LAB — CBC WITH DIFFERENTIAL/PLATELET
Abs Immature Granulocytes: 0.01 10*3/uL (ref 0.00–0.07)
Basophils Absolute: 0 10*3/uL (ref 0.0–0.1)
Basophils Relative: 1 %
Eosinophils Absolute: 0.3 10*3/uL (ref 0.0–0.5)
Eosinophils Relative: 5 %
HCT: 38 % — ABNORMAL LOW (ref 39.0–52.0)
Hemoglobin: 13.3 g/dL (ref 13.0–17.0)
Immature Granulocytes: 0 %
Lymphocytes Relative: 38 %
Lymphs Abs: 2.3 10*3/uL (ref 0.7–4.0)
MCH: 29.4 pg (ref 26.0–34.0)
MCHC: 35 g/dL (ref 30.0–36.0)
MCV: 83.9 fL (ref 80.0–100.0)
Monocytes Absolute: 0.8 10*3/uL (ref 0.1–1.0)
Monocytes Relative: 13 %
Neutro Abs: 2.7 10*3/uL (ref 1.7–7.7)
Neutrophils Relative %: 43 %
Platelets: 215 10*3/uL (ref 150–400)
RBC: 4.53 MIL/uL (ref 4.22–5.81)
RDW: 12.3 % (ref 11.5–15.5)
WBC: 6.1 10*3/uL (ref 4.0–10.5)
nRBC: 0 % (ref 0.0–0.2)

## 2023-06-27 LAB — BASIC METABOLIC PANEL
Anion gap: 10 (ref 5–15)
BUN: 7 mg/dL (ref 6–20)
CO2: 26 mmol/L (ref 22–32)
Calcium: 8.9 mg/dL (ref 8.9–10.3)
Chloride: 102 mmol/L (ref 98–111)
Creatinine, Ser: 1 mg/dL (ref 0.61–1.24)
GFR, Estimated: >60 mL/min (ref >60)
Glucose, Bld: 90 mg/dL (ref 70–99)
Potassium: 3.3 mmol/L — ABNORMAL LOW (ref 3.5–5.1)
Sodium: 138 mmol/L (ref 135–145)

## 2023-06-27 MED ORDER — POTASSIUM CHLORIDE CRYS ER 20 MEQ PO TBCR
40.0000 meq | EXTENDED_RELEASE_TABLET | Freq: Once | ORAL | Status: AC
  Administered 2023-06-27: 40 meq via ORAL
  Filled 2023-06-27: qty 2

## 2023-06-27 NOTE — Discharge Summary (Signed)
Physician Discharge Summary   Patient: Dennis Newton MRN: [id number] DOB: 17-Jul-1976  Admit date:     06/25/2023  Discharge date: 06/27/2023  Discharge Physician: Kathlen Mody   PCP: Patient, No Pcp Per   Recommendations at discharge:  Please follow up with PCP in one week.   Discharge Diagnoses: Principal Problem:   SBO (small bowel obstruction) (HCC) Active Problems:   Adenocarcinoma of prostate (HCC)   Hyperglycemia    Hospital Course: 47 y.o. male with PMH of prostate cancer s/p prostatectomy presented to [facility] ED with acute abdominal pain, nausea and vomiting since last night.   Assessment and Plan:    Small bowel obstruction: Presents with acute abdominal pain, nausea and vomiting.  CT abdomen and pelvis concerning for small bowel obstruction with possible internal hernia.   General surgery consulted and recommended conservative management .  Abd x ray shows Enteric contrast seen within nondilated colon.  NG Tube removed and started on clears and advanced diet as tolerated.  He was cleared for discharge by surgery.      Hyperglycemia:  A1c is 5.3%     History of prostate cancer s/p prostatectomy -Outpatient follow-up        Consultants: gen surgery.  Procedures performed: none  Disposition: Home Diet recommendation:  Discharge Diet Orders (From admission, onward)     Start     Ordered   06/27/23 0000  Diet - low sodium heart healthy        06/27/23 1315           Regular diet DISCHARGE MEDICATION: Allergies as of 06/27/2023   No Known Allergies      Medication List     TAKE these medications    triamcinolone cream 0.1 % Commonly known as: KENALOG Apply 1 Application topically 2 (two) times daily.        Follow-up Information     Nooruddin, Jason Fila, MD Follow up.   Specialty: Internal Medicine Why: July 19, 2023 at 2:15 pm Contact information: [address] [place] [address] [phone]                Discharge  Exam: Ceasar Mons Weights   06/25/23 4696  Weight: 117.9 kg   General exam: Appears calm and comfortable  Respiratory system: Clear to auscultation. Respiratory effort normal. Cardiovascular system: S1 & S2 heard, RRR. No JVD, murmurs, rubs, gallops or clicks. No pedal edema. Gastrointestinal system: Abdomen is nondistended, soft and nontender. No organomegaly or masses felt. Normal bowel sounds heard. Central nervous system: Alert and oriented. No focal neurological deficits. Extremities: Symmetric 5 x 5 power. Skin: No rashes, lesions or ulcers Psychiatry: Judgement and insight appear normal. Mood & affect appropriate.    Condition at discharge: fair  The results of significant diagnostics from this hospitalization (including imaging, microbiology, ancillary and laboratory) are listed below for reference.   Imaging Studies: DG Abd Portable 1V  Result Date: 06/26/2023 CLINICAL DATA:  Nasogastric tube EXAM: PORTABLE ABDOMEN - 1 VIEW COMPARISON:  Yesterday FINDINGS: Enteric tube with tip and side port at the stomach. Contrast is seen within nondilated colon. No dilated bowel seen over the upper abdomen. Clear lung bases IMPRESSION: 1. Located enteric tube with tip and side port at the stomach. 2. Enteric contrast seen within nondilated colon. Electronically Signed   By: Tiburcio Pea M.D.   On: 06/26/2023 05:15   DG Abd Portable 1V-Small Bowel Obstruction Protocol-initial, 8 hr delay  Result Date: 06/25/2023 CLINICAL DATA:  Follow up small  bowel obstruction EXAM: PORTABLE ABDOMEN - 1 VIEW COMPARISON:  Film from earlier in the same day. FINDINGS: Gastric catheter is noted within the distal stomach. Administered contrast lies within the small bowel as well as within the colon consistent with a partial small bowel obstruction. No free air is seen. No other focal abnormality is noted. IMPRESSION: Administered contrast has passed into the colon consistent with a partial small bowel obstruction. The  degree of dilatation has improved when compared with the prior exam. Electronically Signed   By: Alcide Clever M.D.   On: 06/25/2023 21:30   DG Abd Portable 1V-Small Bowel Protocol-Position Verification  Result Date: 06/25/2023 CLINICAL DATA:  Encounter for imaging study to confirm NG tube placement. EXAM: PORTABLE ABDOMEN - 1 VIEW COMPARISON:  One-view abdomen 06/25/2023 at 8:59 a.m. FINDINGS: The NG tube is coiled on itself in the fundus of the stomach. Bowel gas pattern is normal. Lung volumes are low. Lung bases are clear. IMPRESSION: NG tube is coiled on itself in the fundus of the stomach. Electronically Signed   By: Marin Roberts M.D.   On: 06/25/2023 11:29   DG Abdomen 1 View  Result Date: 06/25/2023 CLINICAL DATA:  47 year old male status post nasogastric tube placement. EXAM: ABDOMEN - 1 VIEW COMPARISON:  No priors. FINDINGS: Nasogastric tube noted with tip terminating in the mid body of the stomach. Gas and stool are seen scattered throughout the colon extending to the level of the distal rectum. No pathologic distension of small bowel is noted. No gross evidence of pneumoperitoneum. IMPRESSION: 1. Tip of nasogastric tube is in the body of the stomach. Electronically Signed   By: Trudie Reed M.D.   On: 06/25/2023 09:36   CT ABDOMEN PELVIS W CONTRAST  Result Date: 06/25/2023 CLINICAL DATA:  Epigastric abdominal pain. History of prostate cancer. EXAM: CT ABDOMEN AND PELVIS WITH CONTRAST TECHNIQUE: Multidetector CT imaging of the abdomen and pelvis was performed using the standard protocol following bolus administration of intravenous contrast. RADIATION DOSE REDUCTION: This exam was performed according to the departmental dose-optimization program which includes automated exposure control, adjustment of the mA and/or kV according to patient size and/or use of iterative reconstruction technique. CONTRAST:  OMNIPAQUE IOHEXOL 300 MG/ML  SOLN COMPARISON:  Prior CT abdomen/pelvis  01/16/2015 FINDINGS: Lower chest: No acute abnormality. Hepatobiliary: No focal liver abnormality is seen. No gallstones, gallbladder wall thickening, or biliary dilatation. Pancreas: Unremarkable. No pancreatic ductal dilatation or surrounding inflammatory changes. Spleen: Normal in size without focal abnormality. Adrenals/Urinary Tract: Normal adrenal glands. No hydronephrosis, nephrolithiasis or enhancing renal mass. Circumscribed simple cyst in the interpolar right kidney again noted. Ureters and bladder are unremarkable. Stomach/Bowel: Several loops of abnormal small bowel present in the right lower quadrant and low anterior abdomen just above the bladder. There is submucosal edema and shagginess of the bowel border with inflammatory changes in the associated mesentery. Findings suggest focal bowel obstruction, possible internal hernia. Vascular/Lymphatic: No significant vascular findings are present. No enlarged abdominal or pelvic lymph nodes. Reproductive: Prostate is unremarkable. Other: Trace free fluid in the pelvis is likely reactive. Musculoskeletal: No acute fracture or aggressive appearing lytic or blastic osseous lesion. IMPRESSION: 1. Several loops of focally abnormal bowel are present in the right lower quadrant and low anterior abdomen just above the bladder. The loops are dilated and inflamed with submucosal edema and an irregular shaggy appearance. There appears to be a focal transition point. There is associated inflammatory stranding in the involved mesentery as well as some  reactive free fluid layering in the pelvis. Findings are most suggestive of a focal bowel obstruction and raise concern for internal hernia. Recommend surgical consultation. Electronically Signed   By: Malachy Moan M.D.   On: 06/25/2023 08:03    Microbiology: Results for orders placed or performed during the hospital encounter of 10/30/20  Resp Panel by RT-PCR (Flu A&B, Covid) Nasopharyngeal Swab     Status:  Abnormal   Collection Time: 10/30/20 12:34 AM   Specimen: Nasopharyngeal Swab; Nasopharyngeal(NP) swabs in vial transport medium  Result Value Ref Range Status   SARS Coronavirus 2 by RT PCR POSITIVE (A) NEGATIVE Final    Comment: RESULT CALLED TO, READ BACK BY AND VERIFIED WITH: N FLORES RN 10/30/20 0201 JDW (NOTE) SARS-CoV-2 target nucleic acids are DETECTED.  The SARS-CoV-2 RNA is generally detectable in upper respiratory specimens during the acute phase of infection. Positive results are indicative of the presence of the identified virus, but do not rule out bacterial infection or co-infection with other pathogens not detected by the test. Clinical correlation with patient history and other diagnostic information is necessary to determine patient infection status. The expected result is Negative.  Fact Sheet for Patients: BloggerCourse.com  Fact Sheet for Healthcare Providers: SeriousBroker.it  This test is not yet approved or cleared by the [place] FDA and  has been authorized for detection and/or diagnosis of SARS-CoV-2 by FDA under an Emergency Use Authorization (EUA).  This EUA will remain in effect (meaning this test can be use d) for the duration of  the COVID-19 declaration under Section 564(b)(1) of the Act, 21 U.S.C. section 360bbb-3(b)(1), unless the authorization is terminated or revoked sooner.     Influenza A by PCR NEGATIVE NEGATIVE Final   Influenza B by PCR NEGATIVE NEGATIVE Final    Comment: (NOTE) The Xpert Xpress SARS-CoV-2/FLU/RSV plus assay is intended as an aid in the diagnosis of influenza from Nasopharyngeal swab specimens and should not be used as a sole basis for treatment. Nasal washings and aspirates are unacceptable for Xpert Xpress SARS-CoV-2/FLU/RSV testing.  Fact Sheet for Patients: BloggerCourse.com  Fact Sheet for Healthcare  Providers: SeriousBroker.it  This test is not yet approved or cleared by the [place] FDA and has been authorized for detection and/or diagnosis of SARS-CoV-2 by FDA under an Emergency Use Authorization (EUA). This EUA will remain in effect (meaning this test can be used) for the duration of the COVID-19 declaration under Section 564(b)(1) of the Act, 21 U.S.C. section 360bbb-3(b)(1), unless the authorization is terminated or revoked.  Performed at [facility], [address] [place], [place] [address]     Labs: CBC: Recent Labs  Lab 06/25/23 [phone] 06/26/23 0341 06/27/23 0357  WBC 6.0 8.2 6.1  NEUTROABS  --   --  2.7  HGB 13.6 13.3 13.3  HCT 37.3* 38.1* 38.0*  MCV 84.0 84.1 83.9  PLT 209 216 215   Basic Metabolic Panel: Recent Labs  Lab 06/25/23 0642 06/26/23 0341 06/27/23 0357  NA 139 137 138  K 3.5 3.5 3.3*  CL 102 100 102  CO2 25 26 26   GLUCOSE 145* 100* 90  BUN 12 8 7   CREATININE 1.07 1.03 1.00  CALCIUM 9.1 8.9 8.9   Liver Function Tests: Recent Labs  Lab 06/25/23 0642 06/26/23 0341  AST 15 17  ALT 16 20  ALKPHOS 78 84  BILITOT 0.6 1.3*  PROT 7.1 7.0  ALBUMIN 4.4 3.9   CBG: No results for input(s): "GLUCAP" in  the last 168 hours.  Discharge time spent: 35 minutes.   Signed: Kathlen Mody, MD [facility] 06/27/2023

## 2023-06-27 NOTE — Plan of Care (Signed)
  Problem: Clinical Measurements: Goal: Will remain free from infection Outcome: Progressing   Problem: Clinical Measurements: Goal: Respiratory complications will improve Outcome: Progressing   Problem: Clinical Measurements: Goal: Cardiovascular complication will be avoided Outcome: Progressing   Problem: Nutrition: Goal: Adequate nutrition will be maintained Outcome: Progressing   Problem: Coping: Goal: Level of anxiety will decrease Outcome: Progressing   Problem: Elimination: Goal: Will not experience complications related to urinary retention Outcome: Progressing

## 2023-06-27 NOTE — TOC CM/SW Note (Signed)
PCP follow up appointment scheduled . Information placed on AVS

## 2023-06-27 NOTE — Progress Notes (Signed)
Patient ID: Dennis Newton, male   DOB: 1976/05/20, 47 y.o.   MRN: [id number] [facility] Surgery Progress Note     Subjective: Doing great.  Tolerating CLD.  Moving his bowels with no pain  Objective: Vital signs in last 24 hours: Temp:  [98.3 F (36.8 C)-100.7 F (38.2 C)] 98.3 F (36.8 C) (08/27 0514) Pulse Rate:  [57-65] 57 (08/27 0514) Resp:  [18] 18 (08/27 0514) BP: (120-146)/(71-90) 120/71 (08/27 0514) SpO2:  [96 %-100 %] 99 % (08/27 0514) Last BM Date : 06/26/23  Intake/Output from previous day: 08/26 0701 - 08/27 0700 In: 600 [P.O.:600] Out: -  Intake/Output this shift: No intake/output data recorded.  PE: Gen:  Alert, NAD, pleasant Abd: soft, ND, nontender  Lab Results:  Recent Labs    06/26/23 0341 06/27/23 0357  WBC 8.2 6.1  HGB 13.3 13.3  HCT 38.1* 38.0*  PLT 216 215   BMET Recent Labs    06/26/23 0341 06/27/23 0357  NA 137 138  K 3.5 3.3*  CL 100 102  CO2 26 26  GLUCOSE 100* 90  BUN 8 7  CREATININE 1.03 1.00  CALCIUM 8.9 8.9   PT/INR No results for input(s): "LABPROT", "INR" in the last 72 hours. CMP     Component Value Date/Time   NA 138 06/27/2023 0357   NA 142 11/17/2017 1019   K 3.3 (L) 06/27/2023 0357   CL 102 06/27/2023 0357   CO2 26 06/27/2023 0357   GLUCOSE 90 06/27/2023 0357   BUN 7 06/27/2023 0357   BUN 12 11/17/2017 1019   CREATININE 1.00 06/27/2023 0357   CREATININE 0.95 01/17/2015 0848   CALCIUM 8.9 06/27/2023 0357   PROT 7.0 06/26/2023 0341   PROT 7.5 11/17/2017 1019   ALBUMIN 3.9 06/26/2023 0341   ALBUMIN 4.6 11/17/2017 1019   AST 17 06/26/2023 0341   ALT 20 06/26/2023 0341   ALKPHOS 84 06/26/2023 0341   BILITOT 1.3 (H) 06/26/2023 0341   BILITOT 0.3 11/17/2017 1019   GFRNONAA >60 06/27/2023 0357   GFRAA 89 11/17/2017 1019   Lipase     Component Value Date/Time   LIPASE 33 06/25/2023 0642       Studies/Results: DG Abd Portable 1V  Result Date: 06/26/2023 CLINICAL DATA:  Nasogastric tube  EXAM: PORTABLE ABDOMEN - 1 VIEW COMPARISON:  Yesterday FINDINGS: Enteric tube with tip and side port at the stomach. Contrast is seen within nondilated colon. No dilated bowel seen over the upper abdomen. Clear lung bases IMPRESSION: 1. Located enteric tube with tip and side port at the stomach. 2. Enteric contrast seen within nondilated colon. Electronically Signed   By: Tiburcio Pea M.D.   On: 06/26/2023 05:15   DG Abd Portable 1V-Small Bowel Obstruction Protocol-initial, 8 hr delay  Result Date: 06/25/2023 CLINICAL DATA:  Follow up small bowel obstruction EXAM: PORTABLE ABDOMEN - 1 VIEW COMPARISON:  Film from earlier in the same day. FINDINGS: Gastric catheter is noted within the distal stomach. Administered contrast lies within the small bowel as well as within the colon consistent with a partial small bowel obstruction. No free air is seen. No other focal abnormality is noted. IMPRESSION: Administered contrast has passed into the colon consistent with a partial small bowel obstruction. The degree of dilatation has improved when compared with the prior exam. Electronically Signed   By: Alcide Clever M.D.   On: 06/25/2023 21:30   DG Abd Portable 1V-Small Bowel Protocol-Position Verification  Result Date: 06/25/2023 CLINICAL DATA:  Encounter  for imaging study to confirm NG tube placement. EXAM: PORTABLE ABDOMEN - 1 VIEW COMPARISON:  One-view abdomen 06/25/2023 at 8:59 a.m. FINDINGS: The NG tube is coiled on itself in the fundus of the stomach. Bowel gas pattern is normal. Lung volumes are low. Lung bases are clear. IMPRESSION: NG tube is coiled on itself in the fundus of the stomach. Electronically Signed   By: Marin Roberts M.D.   On: 06/25/2023 11:29   DG Abdomen 1 View  Result Date: 06/25/2023 CLINICAL DATA:  47 year old male status post nasogastric tube placement. EXAM: ABDOMEN - 1 VIEW COMPARISON:  No priors. FINDINGS: Nasogastric tube noted with tip terminating in the mid body of the  stomach. Gas and stool are seen scattered throughout the colon extending to the level of the distal rectum. No pathologic distension of small bowel is noted. No gross evidence of pneumoperitoneum. IMPRESSION: 1. Tip of nasogastric tube is in the body of the stomach. Electronically Signed   By: Trudie Reed M.D.   On: 06/25/2023 09:36    Anti-infectives: Anti-infectives (From admission, onward)    None        Assessment/Plan SBO - tolerating CLD, adv to soft diet - if tolerates can DC home later today -d/w primary service.  ID - none FEN - soft VTE - SCDs, lovenox Foley - none   I reviewed last 24 h vitals and pain scores, last 48 h intake and output, last 24 h labs and trends, and last 24 h imaging results.    LOS: 2 days    Letha Cape, [facility] 06/27/2023, 8:35 AM Please see Amion for pager number during day hours 7:00am-4:30pm

## 2023-07-19 ENCOUNTER — Ambulatory Visit: Payer: BLUE CROSS/BLUE SHIELD | Admitting: Student
